# Patient Record
Sex: Male | Born: 1975 | Race: Black or African American | Hispanic: No | Marital: Single | Smoking: Never smoker
Health system: Southern US, Community
[De-identification: ages and names within clinical notes are randomized; demographics above are authoritative.]

## PROBLEM LIST (undated history)

## (undated) DIAGNOSIS — I10 Essential (primary) hypertension: Secondary | ICD-10-CM

## (undated) HISTORY — DX: Essential (primary) hypertension: I10

---

## 2018-06-30 ENCOUNTER — Ambulatory Visit (INDEPENDENT_AMBULATORY_CARE_PROVIDER_SITE_OTHER): Payer: Medicare Other | Admitting: Family Medicine

## 2018-06-30 ENCOUNTER — Encounter: Payer: Self-pay | Admitting: Family Medicine

## 2018-06-30 VITALS — BP 130/88 | HR 84 | Temp 97.4°F | Resp 16 | Ht 64.0 in | Wt 144.8 lb

## 2018-06-30 DIAGNOSIS — F79 Unspecified intellectual disabilities: Secondary | ICD-10-CM

## 2018-06-30 DIAGNOSIS — Z7689 Persons encountering health services in other specified circumstances: Secondary | ICD-10-CM | POA: Diagnosis not present

## 2018-06-30 DIAGNOSIS — Z23 Encounter for immunization: Secondary | ICD-10-CM

## 2018-06-30 DIAGNOSIS — R7303 Prediabetes: Secondary | ICD-10-CM | POA: Diagnosis not present

## 2018-06-30 DIAGNOSIS — L409 Psoriasis, unspecified: Secondary | ICD-10-CM | POA: Diagnosis not present

## 2018-06-30 NOTE — Progress Notes (Addendum)
Subjective:    Patient ID: Peter Mcmahon, male    DOB: 07/15/1976, 42 y.o.   MRN: [id number]  Peter Mcmahon is a 42 y.o. male presenting on 06/30/2018 for Establish Care (concerned about diabetes family Hx ); Pre-Diabetes; and Psoriasis  Patient provides limited verbal history due to Cognitive Disability, primarily history is provided by patient's friend and roommate / caregiver - Peter Mcmahon here today.  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 He works locally occasionally. Lives with family friend and caregiver.  Pre-Diabetes Reports that he was previously diagnosed about 4-5 yr PreDM, he was not treated and was lost to follow-up for this issue for long time. Friend who he lives with is diabetic. Not checking CBG Meds: None Lifestyle: - Diet (reportedly he has sweet tooth, eats balanced granola, oatmeal, fruits, some chips - drinks some koolaid and juice, less water - does some home cooking) - Exercise (activity, some walking) Denies hypoglycemia, polyuria, visual changes, numbness or tingling.  PSORIASIS Previously living in [place] - previously Dermatology - rx Psoriasis cream and shampoo. Now he is having some worse dry and irritated skin would like to return to Dermatologist for further treatment. Never had history of arthritis.  Elevated BP Initial elevated BP, no prior dx HTN. Not checking BP at home but they can. Fam history mother and grandmother on mom side HTN - sister  Health Maintenance:  Due for routine HIV - no high risk history but would like to check.  Colon CA Screening: Never had colonoscopy. Currently asymptomatic. Known family history of colon CA, father age 88>60. Nealry due for screening test age 49>45 will place referral in future for GI   No flowsheet data found.  History  reviewed. No pertinent past medical history. History reviewed. No pertinent surgical history. Social History   Socioeconomic History  . Marital status: Single    Spouse name: Not on file  . Number of children: Not on file  . Years of education: Not on file  . Highest education level: Not on file  Occupational History  . Not on file  Social Needs  . Financial resource strain: Not on file  . Food insecurity:    Worry: Not on file    Inability: Not on file  . Transportation needs:    Medical: Not on file    Non-medical: Not on file  Tobacco Use  . Smoking status: Never Smoker  . Smokeless tobacco: Never Used  Substance and Sexual Activity  . Alcohol use: Never    Frequency: Never  . Drug use: Never  . Sexual activity: Not on file  Lifestyle  . Physical activity:    Days per week: Not on file    Minutes per session: Not on file  . Stress: Not on file  Relationships  . Social connections:    Talks on phone: Not on file    Gets together: Not on file    Attends religious service: Not on file    Active member of club or organization: Not on file    Attends meetings of clubs or organizations: Not on file    Relationship status: Not on file  . Intimate partner violence:    Fear of current or ex partner: Not on file    Emotionally abused: Not on file    Physically abused: Not on file    Forced  sexual activity: Not on file  Other Topics Concern  . Not on file  Social History Narrative  . Not on file   Family History  Problem Relation Age of Onset  . Diabetes Father   . Colon cancer Father   . Hypertension Mother   . Diabetes Sister   . Diabetes Brother   . Diabetes Paternal Uncle   . Diabetes Paternal Grandfather   . Prostate cancer Neg Hx    No current outpatient medications on file prior to visit.   No current facility-administered medications on file prior to visit.     Review of Systems Per HPI unless specifically indicated above      Objective:    BP  130/88 (BP Location: Left Arm, Cuff Size: Normal)   Pulse 84   Temp (!) 97.4 F (36.3 C) (Oral)   Resp 16   Ht 5\' 4"  (1.626 m)   Wt 144 lb 12.8 oz (65.7 kg)   BMI 24.85 kg/m   Wt Readings from Last 3 Encounters:  06/30/18 144 lb 12.8 oz (65.7 kg)    Physical Exam  Constitutional: He is oriented to person, place, and time. He appears well-developed and well-nourished. No distress.  Well-appearing, comfortable, cooperative  HENT:  Head: Normocephalic and atraumatic.  Mouth/Throat: Oropharynx is clear and moist.  Eyes: Conjunctivae are normal. Right eye exhibits no discharge. Left eye exhibits no discharge.  Cardiovascular: Normal rate.  Pulmonary/Chest: Effort normal.  Musculoskeletal: He exhibits no edema.  Neurological: He is alert and oriented to person, place, and time.  Skin: Skin is warm and dry. No rash noted. He is not diaphoretic. No erythema.  Well groomed, good eye contact, speech is slowed at times, and often has difficulty following conversation, thoughts are appropriate but simple, has difficulty comprehending more complex thoughts and conversation. Follows commands well. Pleasant.  Psychiatric: He has a normal mood and affect. His behavior is normal.  Well groomed, good eye contact, normal speech and thoughts  Nursing note and vitals reviewed.  No results found for this or any previous visit.    Assessment & Plan:   Problem List Items Addressed This Visit    Intellectual disability    Stable chronic problem Functions well especially with assistance of friend and caregiver      Pre-diabetes - Primary    Uncertain control, prior old result >4-5 years ago Due for labs in near future  Plan:  1. Not on any therapy currently  2. Encourage improved lifestyle - low carb, low sugar diet, reduce portion size, continue improving regular exercise - handout on diet given 3. Follow-up within 2-4 weeks for annual and labs       Psoriasis    Stable chronic problem with  some skin manifestation Referral to local Dermatology for further management      Relevant Orders   Ambulatory referral to Dermatology    Other Visit Diagnoses    Encounter to establish care with new doctor     Reques outside PCP records for review    Need for Tdap vaccination       Relevant Orders   Tdap vaccine greater than or equal to 7yo IM (Completed)      No orders of the defined types were placed in this encounter.  Orders Placed This Encounter  Procedures  . Tdap vaccine greater than or equal to 7yo IM  . Ambulatory referral to Dermatology    Referral Priority:   Routine    Referral Type:  Consultation    Referral Reason:   Specialty Services Required    Referred to Provider:   Dasher, Cliffton Astersavid A, MD    Requested Specialty:   Dermatology    Number of Visits Requested:   1    Follow up plan: Return in about 5 weeks (around 08/04/2018) for Annual Physical.  Future labs ordered for 08/03/18  Saralyn PilarAlexander Mavric Cortright, DO [facility] 07/01/2018, 1:10 AM

## 2018-06-30 NOTE — Patient Instructions (Addendum)
Thank you for coming to the office today.  Tdap vaccine today - good for 10 years  Referral to Dermatology for Psoriasis - stay tuned for apt - call them in 1 week.  [facility] [address] [place], [place] [address] Phone: [phone]  Arin L. Roseanne KaufmanIsenstein, MD  Pilar Plateavid Dasher, MD -------------------------------------------------------------  Elevated BP initially - repeat check was improved to 130/88 - not quite at level of high blood pressure, we need to monitor closely - check at home few times a month, write down, bring back to office.  Goal < 140/90   DUE for FASTING BLOOD WORK (no food or drink after midnight before the lab appointment, only water or coffee without cream/sugar on the morning of)  SCHEDULE "Lab Only" visit in the morning at the clinic for lab draw in 4-5 WEEKS   - Make sure Lab Only appointment is at about 1 week before your next appointment, so that results will be available  For Lab Results, once available within 2-3 days of blood draw, you can can log in to MyChart online to view your results and a brief explanation. Also, we can discuss results at next follow-up visit.   Please schedule a Follow-up Appointment to: Return in about 5 weeks (around 08/04/2018) for Annual Physical.  If you have any other questions or concerns, please feel free to call the office or send a message through MyChart. You may also schedule an earlier appointment if necessary.  Additionally, you may be receiving a survey about your experience at our office within a few days to 1 week by e-mail or mail. We value your feedback.  Saralyn PilarAlexander Keali Mccraw, DO [facility]

## 2018-07-01 ENCOUNTER — Other Ambulatory Visit: Payer: Self-pay | Admitting: Family Medicine

## 2018-07-01 ENCOUNTER — Encounter: Payer: Self-pay | Admitting: Family Medicine

## 2018-07-01 DIAGNOSIS — L409 Psoriasis, unspecified: Secondary | ICD-10-CM

## 2018-07-01 DIAGNOSIS — Z114 Encounter for screening for human immunodeficiency virus [HIV]: Secondary | ICD-10-CM

## 2018-07-01 DIAGNOSIS — Z0000 Encounter for general adult medical examination without abnormal findings: Secondary | ICD-10-CM

## 2018-07-01 DIAGNOSIS — R7303 Prediabetes: Secondary | ICD-10-CM

## 2018-07-01 DIAGNOSIS — F79 Unspecified intellectual disabilities: Secondary | ICD-10-CM

## 2018-07-01 NOTE — Assessment & Plan Note (Signed)
Uncertain control, prior old result >4-5 years ago Due for labs in near future  Plan:  1. Not on any therapy currently  2. Encourage improved lifestyle - low carb, low sugar diet, reduce portion size, continue improving regular exercise - handout on diet given 3. Follow-up within 2-4 weeks for annual and labs

## 2018-07-01 NOTE — Assessment & Plan Note (Signed)
Stable chronic problem with some skin manifestation Referral to local Dermatology for further management

## 2018-07-01 NOTE — Assessment & Plan Note (Signed)
Stable chronic problem Functions well especially with assistance of friend and caregiver 

## 2018-08-03 ENCOUNTER — Other Ambulatory Visit: Payer: Medicare Other

## 2018-08-03 DIAGNOSIS — Z114 Encounter for screening for human immunodeficiency virus [HIV]: Secondary | ICD-10-CM

## 2018-08-03 DIAGNOSIS — R7303 Prediabetes: Secondary | ICD-10-CM

## 2018-08-03 DIAGNOSIS — L409 Psoriasis, unspecified: Secondary | ICD-10-CM

## 2018-08-03 DIAGNOSIS — Z0000 Encounter for general adult medical examination without abnormal findings: Secondary | ICD-10-CM

## 2018-08-04 LAB — COMPLETE METABOLIC PANEL WITH GFR
AG RATIO: 1.3 (calc) (ref 1.0–2.5)
ALBUMIN MSPROF: 4.4 g/dL (ref 3.6–5.1)
ALKALINE PHOSPHATASE (APISO): 78 U/L (ref 40–115)
ALT: 19 U/L (ref 9–46)
AST: 20 U/L (ref 10–40)
BUN: 20 mg/dL (ref 7–25)
CO2: 29 mmol/L (ref 20–32)
CREATININE: 1.02 mg/dL (ref 0.60–1.35)
Calcium: 9.4 mg/dL (ref 8.6–10.3)
Chloride: 105 mmol/L (ref 98–110)
GFR, Est African American: 103 mL/min/{1.73_m2} (ref >=60)
GFR, Est Non African American: 89 mL/min/{1.73_m2} (ref >=60)
GLOBULIN: 3.4 g/dL (ref 1.9–3.7)
Glucose, Bld: 103 mg/dL — ABNORMAL HIGH (ref 65–99)
Potassium: 4.8 mmol/L (ref 3.5–5.3)
SODIUM: 141 mmol/L (ref 135–146)
Total Bilirubin: 0.7 mg/dL (ref 0.2–1.2)
Total Protein: 7.8 g/dL (ref 6.1–8.1)

## 2018-08-04 LAB — CBC WITH DIFFERENTIAL/PLATELET
Basophils Absolute: 22 cells/uL (ref 0–200)
Basophils Relative: 0.5 %
Eosinophils Absolute: 40 cells/uL (ref 15–500)
Eosinophils Relative: 0.9 %
HCT: 37.7 % — ABNORMAL LOW (ref 38.5–50.0)
Hemoglobin: 13 g/dL — ABNORMAL LOW (ref 13.2–17.1)
Lymphs Abs: 1404 cells/uL (ref 850–3900)
MCH: 31.4 pg (ref 27.0–33.0)
MCHC: 34.5 g/dL (ref 32.0–36.0)
MCV: 91.1 fL (ref 80.0–100.0)
MPV: 11.3 fL (ref 7.5–12.5)
Monocytes Relative: 13.1 %
NEUTROS PCT: 53.6 %
Neutro Abs: 2358 cells/uL (ref 1500–7800)
PLATELETS: 171 10*3/uL (ref 140–400)
RBC: 4.14 10*6/uL — AB (ref 4.20–5.80)
RDW: 13.3 % (ref 11.0–15.0)
TOTAL LYMPHOCYTE: 31.9 %
WBC: 4.4 10*3/uL (ref 3.8–10.8)
WBCMIX: 576 {cells}/uL (ref 200–950)

## 2018-08-04 LAB — LIPID PANEL
CHOLESTEROL: 240 mg/dL — AB (ref <200)
HDL: 53 mg/dL (ref >40)
LDL CHOLESTEROL (CALC): 169 mg/dL — AB
Non-HDL Cholesterol (Calc): 187 mg/dL (calc) — ABNORMAL HIGH (ref <130)
TRIGLYCERIDES: 81 mg/dL (ref <150)
Total CHOL/HDL Ratio: 4.5 (calc) (ref <5.0)

## 2018-08-04 LAB — HIV ANTIBODY (ROUTINE TESTING W REFLEX): HIV: NONREACTIVE

## 2018-08-04 LAB — HEMOGLOBIN A1C
EAG (MMOL/L): 6.3 (calc)
Hgb A1c MFr Bld: 5.6 % of total Hgb (ref <5.7)
Mean Plasma Glucose: 114 (calc)

## 2018-08-10 ENCOUNTER — Ambulatory Visit (INDEPENDENT_AMBULATORY_CARE_PROVIDER_SITE_OTHER): Payer: Medicare Other | Admitting: Family Medicine

## 2018-08-10 ENCOUNTER — Encounter: Payer: Self-pay | Admitting: Family Medicine

## 2018-08-10 VITALS — BP 136/84 | HR 78 | Temp 97.8°F | Resp 16 | Ht 64.0 in | Wt 146.0 lb

## 2018-08-10 DIAGNOSIS — R030 Elevated blood-pressure reading, without diagnosis of hypertension: Secondary | ICD-10-CM

## 2018-08-10 DIAGNOSIS — I10 Essential (primary) hypertension: Secondary | ICD-10-CM | POA: Insufficient documentation

## 2018-08-10 DIAGNOSIS — K1379 Other lesions of oral mucosa: Secondary | ICD-10-CM

## 2018-08-10 DIAGNOSIS — F79 Unspecified intellectual disabilities: Secondary | ICD-10-CM

## 2018-08-10 DIAGNOSIS — R7303 Prediabetes: Secondary | ICD-10-CM

## 2018-08-10 DIAGNOSIS — Z0000 Encounter for general adult medical examination without abnormal findings: Secondary | ICD-10-CM

## 2018-08-10 DIAGNOSIS — L409 Psoriasis, unspecified: Secondary | ICD-10-CM

## 2018-08-10 DIAGNOSIS — E7800 Pure hypercholesterolemia, unspecified: Secondary | ICD-10-CM

## 2018-08-10 NOTE — Assessment & Plan Note (Signed)
Stable chronic problem Functions well especially with assistance of friend and caregiver 

## 2018-08-10 NOTE — Progress Notes (Signed)
Subjective:    Patient ID: Peter Mcmahon, male    DOB: 07/02/1976, 42 y.o.   MRN: [id number]  Peter Mcmahon is a 42 y.o. male presenting on 08/10/2018 for Annual Exam  Patient provides limited verbal history due to Cognitive Disability, primarily history is provided by patient's friend and roommate / caregiver - [name] here today.  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 He works locally occasionally. Lives with family friend and caregiver.  Pre-Diabetes Last result A1c 5.6 Not checking CBG Meds: None Lifestyle: - Diet (Still trying to improve diet, low carb, he has sweet tooth, eats balanced granola, oatmeal, fruits, some chips - drinks some koolaid and juice, less water - does some home cooking) - Exercise (activity, some walking) Denies hypoglycemia  HYPERLIPIDEMIA: - Last lipid panel 07/2018, abnormal elevated LDL Not taking Statin therapy  PSORIASIS Followed by [facility]. On current topical cream with relief  Easy Bleeding / Dental work Recently seen by [facility], had problems with easy bleeding from multiple tooth extraction. They requested copy of his most recent lab work. Had ibuprofen, hydrocodone PRN  Elevated BP w/o HTN Interval readings of BP since last visit, showed improved BP 120-130 on avg / 70-80s. Occasional higher reading. Fam history mother and grandmother on mom side HTN - sister Not on medication  Health Maintenance:  Due for Flu Shot, declines today despite counseling on benefits  UTD routine HIV screen, negative  Colon CA Screening: Never had colonoscopy. Currently asymptomatic. Known family history of colon CA, father age 61>60. Nealry due for screening test age 34>45 will place referral in future for GI   Dep[facility]  08/10/2018  Decreased Interest 0  Down, Depressed, Hopeless 0  PHQ - 2 Score 0    History reviewed. No pertinent past medical history. History reviewed. No pertinent surgical history. Social History   Socioeconomic History  . Marital status: Single    Spouse name: Not on file  . Number of children: Not on file  . Years of education: Not on file  . Highest education level: Not on file  Occupational History  . Not on file  Social Needs  . Financial resource strain: Not on file  . Food insecurity:    Worry: Not on file    Inability: Not on file  . Transportation needs:    Medical: Not on file    Non-medical: Not on file  Tobacco Use  . Smoking status: Never Smoker  . Smokeless tobacco: Never Used  Substance and Sexual Activity  . Alcohol use: Never    Frequency: Never  . Drug use: Never  . Sexual activity: Not on file  Lifestyle  . Physical activity:    Days per week: Not on file    Minutes per session: Not on file  . Stress: Not on file  Relationships  . Social connections:    Talks on phone: Not on file    Gets together: Not on file    Attends religious service: Not on file    Active member of club or organization: Not on file    Attends meetings of clubs or organizations: Not on file    Relationship status: Not on file  . Intimate partner violence:    Fear of current or ex partner: Not on file    Emotionally abused: Not on file  Physically abused: Not on file    Forced sexual activity: Not on file  Other Topics Concern  . Not on file  Social History Narrative  . Not on file   Family History  Problem Relation Age of Onset  . Diabetes Father   . Colon cancer Father   . Hypertension Mother   . Diabetes Sister   . Diabetes Brother   . Diabetes Paternal Uncle   . Diabetes Paternal Grandfather   . Prostate cancer Neg Hx    No current outpatient medications on file prior to visit.   No current facility-administered medications on file prior to visit.       Review of Systems  Constitutional: Negative for activity change, appetite change, chills, diaphoresis, fatigue and fever.  HENT: Negative for congestion and hearing loss.   Eyes: Negative for visual disturbance.  Respiratory: Negative for apnea, cough, choking, chest tightness, shortness of breath and wheezing.   Cardiovascular: Negative for chest pain, palpitations and leg swelling.  Gastrointestinal: Negative for abdominal pain, anal bleeding, blood in stool, constipation, diarrhea, nausea and vomiting.  Endocrine: Negative for cold intolerance.  Genitourinary: Negative for decreased urine volume, difficulty urinating, dysuria, frequency, hematuria, testicular pain and urgency.  Musculoskeletal: Negative for arthralgias, back pain and neck pain.  Skin: Negative for rash.  Allergic/Immunologic: Negative for environmental allergies.  Neurological: Negative for dizziness, weakness, light-headedness, numbness and headaches.  Hematological: Negative for adenopathy. Bruises/bleeds easily.  Psychiatric/Behavioral: Negative for behavioral problems, dysphoric mood and sleep disturbance. The patient is not nervous/anxious.    Per HPI unless specifically indicated above     Objective:    BP 136/84 (BP Location: Left Arm, Cuff Size: Normal)   Pulse 78   Temp 97.8 F (36.6 C) (Oral)   Resp 16   Ht 5\' 4"  (1.626 m)   Wt 146 lb (66.2 kg)   BMI 25.06 kg/m   Wt Readings from Last 3 Encounters:  08/10/18 146 lb (66.2 kg)  06/30/18 144 lb 12.8 oz (65.7 kg)    Physical Exam  Constitutional: He is oriented to person, place, and time. He appears well-developed and well-nourished. No distress.  Well-appearing, comfortable, cooperative  HENT:  Head: Normocephalic and atraumatic.  Mouth/Throat: Oropharynx is clear and moist.  Poor dentition, missing several teeth. No oral lesions or ulceration or bleeding today.  Eyes: Pupils are equal, round, and reactive to light. Conjunctivae and EOM are  normal. Right eye exhibits no discharge. Left eye exhibits no discharge.  Neck: Normal range of motion. Neck supple. No thyromegaly present.  Cardiovascular: Normal rate, regular rhythm, normal heart sounds and intact distal pulses.  No murmur heard. Pulmonary/Chest: Effort normal and breath sounds normal. No respiratory distress. He has no wheezes. He has no rales.  Abdominal: Soft. Bowel sounds are normal. He exhibits no distension and no mass. There is no tenderness.  Musculoskeletal: Normal range of motion. He exhibits no edema or tenderness.  Upper / Lower Extremities: - Normal muscle tone, strength bilateral upper extremities 5/5, lower extremities 5/5  Lymphadenopathy:    He has no cervical adenopathy.  Neurological: He is alert and oriented to person, place, and time.  Distal sensation intact to light touch all extremities  Skin: Skin is warm and dry. No rash noted. He is not diaphoretic. No erythema.  Psychiatric: He has a normal mood and affect. His behavior is normal.  Well groomed, good eye contact, speech is slowed at times, and often has difficulty following conversation, thoughts are appropriate but  simple, has difficulty comprehending more complex thoughts and conversation. Follows commands well. Pleasant.   Nursing note and vitals reviewed.  Results for orders placed or performed in visit on 08/03/18  HIV antibody  Result Value Ref Range   HIV 1&2 Ab, 4th Generation NON-REACTIVE NON-REACTI  Lipid panel  Result Value Ref Range   Cholesterol 240 (H) <200 mg/dL   HDL 53 >40 mg/dL   Triglycerides 81 <981 mg/dL   LDL Cholesterol (Calc) 169 (H) mg/dL (calc)   Total CHOL/HDL Ratio 4.5 <5.0 (calc)   Non-HDL Cholesterol (Calc) 187 (H) <130 mg/dL (calc)  COMPLETE METABOLIC PANEL WITH GFR  Result Value Ref Range   Glucose, Bld 103 (H) 65 - 99 mg/dL   BUN 20 7 - 25 mg/dL   Creat 1.91 4.78 - 2.95 mg/dL   GFR, Est Non African American 89 > OR = 60 mL/min/1.7m2   GFR, Est  African American 103 > OR = 60 mL/min/1.7m2   BUN/Creatinine Ratio NOT APPLICABLE 6 - 22 (calc)   Sodium 141 135 - 146 mmol/L   Potassium 4.8 3.5 - 5.3 mmol/L   Chloride 105 98 - 110 mmol/L   CO2 29 20 - 32 mmol/L   Calcium 9.4 8.6 - 10.3 mg/dL   Total Protein 7.8 6.1 - 8.1 g/dL   Albumin 4.4 3.6 - 5.1 g/dL   Globulin 3.4 1.9 - 3.7 g/dL (calc)   AG Ratio 1.3 1.0 - 2.5 (calc)   Total Bilirubin 0.7 0.2 - 1.2 mg/dL   Alkaline phosphatase (APISO) 78 40 - 115 U/L   AST 20 10 - 40 U/L   ALT 19 9 - 46 U/L  CBC with Differential/Platelet  Result Value Ref Range   WBC 4.4 3.8 - 10.8 Thousand/uL   RBC 4.14 (L) 4.20 - 5.80 Million/uL   Hemoglobin 13.0 (L) 13.2 - 17.1 g/dL   HCT 62.1 (L) 30.8 - 65.7 %   MCV 91.1 80.0 - 100.0 fL   MCH 31.4 27.0 - 33.0 pg   MCHC 34.5 32.0 - 36.0 g/dL   RDW 84.6 96.2 - 95.2 %   Platelets 171 140 - 400 Thousand/uL   MPV 11.3 7.5 - 12.5 fL   Neutro Abs 2,358 1,500 - 7,800 cells/uL   Lymphs Abs 1,404 850 - 3,900 cells/uL   WBC mixed population 576 200 - 950 cells/uL   Eosinophils Absolute 40 15 - 500 cells/uL   Basophils Absolute 22 0 - 200 cells/uL   Neutrophils Relative % 53.6 %   Total Lymphocyte 31.9 %   Monocytes Relative 13.1 %   Eosinophils Relative 0.9 %   Basophils Relative 0.5 %  Hemoglobin A1c  Result Value Ref Range   Hgb A1c MFr Bld 5.6 <5.7 % of total Hgb   Mean Plasma Glucose 114 (calc)   eAG (mmol/L) 6.3 (calc)      Assessment & Plan:   Problem List Items Addressed This Visit    Elevated BP without diagnosis of hypertension    Elevated BP on initial reading, improve on re-check Home readings improved  Plan Reassurance Continue monitor BP at home, record readings Followup if elevated      Hypercholesteremia    Uncontrolled cholesterol elevated LDL, poor lifestyle, improving Last lipid panel 07/2018  Plan: 1. ASCVD risk not indicated for statin at this time 2. Encourage improved lifestyle - low carb/cholesterol, reduce  portion size, continue improving regular exercise Follow-up yearly       Intellectual disability    Stable  chronic problem Functions well especially with assistance of friend and caregiver      Pre-diabetes    Stable, controlled A1c 5.6  Plan:  1. Not on any therapy currently  2. Encourage improved lifestyle - low carb, low sugar diet, reduce portion size, continue improving regular exercise - handout on diet given 3. Follow-up 6 mo A1c      Psoriasis    Followed by Dermatology On topical for psoriasis       Other Visit Diagnoses    Annual physical exam    -  Primary  Updated Health Maintenance information Reviewed recent lab results with patient Encouraged improvement to lifestyle with diet and exercise    Oral bleeding     Uncertain etiology, reviewed labs unremarkable CBC except mild low Hgb only borderline, no other indices to suggest bleeding disorder. Normal LFTs. Given copy to patient to give to dentist. If they request we can add future INR or other coag testing vs refer to heme if still clinical concern.      No orders of the defined types were placed in this encounter.   Follow up plan: Return in about 6 months (around 02/08/2019) for Elevated Sugar check A1c / Elevated BP check.  Saralyn Pilar, DO [facility] 08/10/2018, 2:33 PM

## 2018-08-10 NOTE — Patient Instructions (Addendum)
Thank you for coming to the office today.  Elevated BP initially - repeat check was improved - not quite at level of high blood pressure, we need to monitor closely - check at home few times a month, write down, bring back to office.  Goal < 140/90  Can check BP at home 1-2 times a day ONCE A WEEK, for now, and then space out to every 2 WEEKS in future.  Bring copy of lab results to Dentist  Only other lab test I would offer would be an INR test. Or if they need additional testing we can refer to a Hematology specialist  If change mind on Flu shot, can return.  Please schedule a Follow-up Appointment to: Return in about 6 months (around 02/08/2019) for Elevated Sugar check A1c / Elevated BP check.  If you have any other questions or concerns, please feel free to call the office or send a message through MyChart. You may also schedule an earlier appointment if necessary.  Additionally, you may be receiving a survey about your experience at our office within a few days to 1 week by e-mail or mail. We value your feedback.  Saralyn PilarAlexander [name], DO [facility]

## 2018-08-10 NOTE — Assessment & Plan Note (Signed)
Followed by Dermatology On topical for psoriasis

## 2018-08-10 NOTE — Assessment & Plan Note (Signed)
Stable, controlled A1c 5.6  Plan:  1. Not on any therapy currently  2. Encourage improved lifestyle - low carb, low sugar diet, reduce portion size, continue improving regular exercise - handout on diet given 3. Follow-up 6 mo A1c

## 2018-08-11 NOTE — Assessment & Plan Note (Signed)
Elevated BP on initial reading, improve on re-check Home readings improved  Plan Reassurance Continue monitor BP at home, record readings Followup if elevated

## 2018-08-11 NOTE — Assessment & Plan Note (Signed)
Uncontrolled cholesterol elevated LDL, poor lifestyle, improving Last lipid panel 07/2018  Plan: 1. ASCVD risk not indicated for statin at this time 2. Encourage improved lifestyle - low carb/cholesterol, reduce portion size, continue improving regular exercise Follow-up yearly

## 2018-11-09 ENCOUNTER — Telehealth: Payer: Self-pay | Admitting: Family Medicine

## 2018-11-09 NOTE — Telephone Encounter (Signed)
Tried to call patient to schedule AWV-I.  No answer.  Left message on answering machine to call Misty StanleyLisa at [phone]. lec

## 2018-11-12 NOTE — Telephone Encounter (Signed)
On 11/09/18, patient scheduled AWV for 01/15/19 at 10:40 am. lec

## 2019-01-05 ENCOUNTER — Ambulatory Visit (INDEPENDENT_AMBULATORY_CARE_PROVIDER_SITE_OTHER): Payer: Medicare Other

## 2019-01-05 VITALS — BP 132/70 | HR 74 | Temp 98.9°F | Resp 16 | Ht 64.0 in | Wt 152.0 lb

## 2019-01-05 DIAGNOSIS — Z0000 Encounter for general adult medical examination without abnormal findings: Secondary | ICD-10-CM | POA: Diagnosis not present

## 2019-01-05 NOTE — Patient Instructions (Addendum)
Peter Mcmahon , Thank you for taking time to come for your Medicare Wellness Visit. I appreciate your ongoing commitment to your health goals. Please review the following plan we discussed and let me know if I can assist you in the future.   Screening recommendations/referrals: Colonoscopy: due at age 45 due to family hx Recommended yearly ophthalmology/optometry visit for glaucoma screening and checkup Recommended yearly dental visit for hygiene and checkup  Vaccinations: Influenza vaccine: up to date Pneumococcal vaccine: not indicated, due at age 70 Tdap vaccine: up to date Shingles vaccine: eligible at age 36    Advanced directives: Advance directive discussed with you today. I have provided a copy for you to complete at home and have notarized. Once this is complete please bring a copy in to our office so we can scan it into your chart.  Conditions/risks identified: none  Next appointment: Follow up on 02/08/2019 at 10:40am with Dr.[name]. Follow up in one year for your annual wellness exam.   Preventive Care 40-64 Years, Male Preventive care refers to lifestyle choices and visits with your health care provider that can promote health and wellness. What does preventive care include?  A yearly physical exam. This is also called an annual well check.  Dental exams once or twice a year.  Routine eye exams. Ask your health care provider how often you should have your eyes checked.  Personal lifestyle choices, including:  Daily care of your teeth and gums.  Regular physical activity.  Eating a healthy diet.  Avoiding tobacco and drug use.  Limiting alcohol use.  Practicing safe sex.  Taking low-dose aspirin every day starting at age 41. What happens during an annual well check? The services and screenings done by your health care provider during your annual well check will depend on your age, overall health, lifestyle risk factors, and family history of  disease. Counseling  Your health care provider may ask you questions about your:  Alcohol use.  Tobacco use.  Drug use.  Emotional well-being.  Home and relationship well-being.  Sexual activity.  Eating habits.  Work and work Astronomer. Screening  You may have the following tests or measurements:  Height, weight, and BMI.  Blood pressure.  Lipid and cholesterol levels. These may be checked every 5 years, or more frequently if you are over 84 years old.  Skin check.  Lung cancer screening. You may have this screening every year starting at age 66 if you have a 30-pack-year history of smoking and currently smoke or have quit within the past 15 years.  Fecal occult blood test (FOBT) of the stool. You may have this test every year starting at age 51.  Flexible sigmoidoscopy or colonoscopy. You may have a sigmoidoscopy every 5 years or a colonoscopy every 10 years starting at age 9.  Prostate cancer screening. Recommendations will vary depending on your family history and other risks.  Hepatitis C blood test.  Hepatitis B blood test.  Sexually transmitted disease (STD) testing.  Diabetes screening. This is done by checking your blood sugar (glucose) after you have not eaten for a while (fasting). You may have this done every 1-3 years. Discuss your test results, treatment options, and if necessary, the need for more tests with your health care provider. Vaccines  Your health care provider may recommend certain vaccines, such as:  Influenza vaccine. This is recommended every year.  Tetanus, diphtheria, and acellular pertussis (Tdap, Td) vaccine. You may need a Td booster every 10 years.  Zoster vaccine. You may need this after age 76.  Pneumococcal 13-valent conjugate (PCV13) vaccine. You may need this if you have certain conditions and have not been vaccinated.  Pneumococcal polysaccharide (PPSV23) vaccine. You may need one or two doses if you smoke cigarettes  or if you have certain conditions. Talk to your health care provider about which screenings and vaccines you need and how often you need them. This information is not intended to replace advice given to you by your health care provider. Make sure you discuss any questions you have with your health care provider. Document Released: 12/01/2015 Document Revised: 07/24/2016 Document Reviewed: 09/05/2015 Elsevier Interactive Patient Education  2017 [employer] Prevention in the Home Falls can cause injuries. They can happen to people of all ages. There are many things you can do to make your home safe and to help prevent falls. What can I do on the outside of my home?  Regularly fix the edges of walkways and driveways and fix any cracks.  Remove anything that might make you trip as you walk through a door, such as a raised step or threshold.  Trim any bushes or trees on the path to your home.  Use bright outdoor lighting.  Clear any walking paths of anything that might make someone trip, such as rocks or tools.  Regularly check to see if handrails are loose or broken. Make sure that both sides of any steps have handrails.  Any raised decks and porches should have guardrails on the edges.  Have any leaves, snow, or ice cleared regularly.  Use sand or salt on walking paths during winter.  Clean up any spills in your garage right away. This includes oil or grease spills. What can I do in the bathroom?  Use night lights.  Install grab bars by the toilet and in the tub and shower. Do not use towel bars as grab bars.  Use non-skid mats or decals in the tub or shower.  If you need to sit down in the shower, use a plastic, non-slip stool.  Keep the floor dry. Clean up any water that spills on the floor as soon as it happens.  Remove soap buildup in the tub or shower regularly.  Attach bath mats securely with double-sided non-slip rug tape.  Do not have throw rugs and other  things on the floor that can make you trip. What can I do in the bedroom?  Use night lights.  Make sure that you have a light by your bed that is easy to reach.  Do not use any sheets or blankets that are too big for your bed. They should not hang down onto the floor.  Have a firm chair that has side arms. You can use this for support while you get dressed.  Do not have throw rugs and other things on the floor that can make you trip. What can I do in the kitchen?  Clean up any spills right away.  Avoid walking on wet floors.  Keep items that you use a lot in easy-to-reach places.  If you need to reach something above you, use a strong step stool that has a grab bar.  Keep electrical cords out of the way.  Do not use floor polish or wax that makes floors slippery. If you must use wax, use non-skid floor wax.  Do not have throw rugs and other things on the floor that can make you trip. What can I do with  my stairs?  Do not leave any items on the stairs.  Make sure that there are handrails on both sides of the stairs and use them. Fix handrails that are broken or loose. Make sure that handrails are as long as the stairways.  Check any carpeting to make sure that it is firmly attached to the stairs. Fix any carpet that is loose or worn.  Avoid having throw rugs at the top or bottom of the stairs. If you do have throw rugs, attach them to the floor with carpet tape.  Make sure that you have a light switch at the top of the stairs and the bottom of the stairs. If you do not have them, ask someone to add them for you. What else can I do to help prevent falls?  Wear shoes that:  Do not have high heels.  Have rubber bottoms.  Are comfortable and fit you well.  Are closed at the toe. Do not wear sandals.  If you use a stepladder:  Make sure that it is fully opened. Do not climb a closed stepladder.  Make sure that both sides of the stepladder are locked into place.  Ask  someone to hold it for you, if possible.  Clearly mark and make sure that you can see:  Any grab bars or handrails.  First and last steps.  Where the edge of each step is.  Use tools that help you move around (mobility aids) if they are needed. These include:  Canes.  Walkers.  Scooters.  Crutches.  Turn on the lights when you go into a dark area. Replace any light bulbs as soon as they burn out.  Set up your furniture so you have a clear path. Avoid moving your furniture around.  If any of your floors are uneven, fix them.  If there are any pets around you, be aware of where they are.  Review your medicines with your doctor. Some medicines can make you feel dizzy. This can increase your chance of falling. Ask your doctor what other things that you can do to help prevent falls. This information is not intended to replace advice given to you by your health care provider. Make sure you discuss any questions you have with your health care provider. Document Released: 08/31/2009 Document Revised: 04/11/2016 Document Reviewed: 12/09/2014 Elsevier Interactive Patient Education  2017 [employer].

## 2019-01-05 NOTE — Progress Notes (Signed)
Subjective:   Peter Mcmahon is a 43 y.o. male who presents for an Initial Medicare Annual Wellness Visit.  Review of Systems  Peter Mcmahon, patients fiance is with him today and helps with patients medical history.       Objective:    Today's Vitals   01/05/19 1442  BP: 132/70  Pulse: 74  Resp: 16  Temp: 98.9 F (37.2 C)  TempSrc: Oral  Weight: 152 lb (68.9 kg)  Height: 5\' 4"  (1.626 m)  PainSc: 0-No pain   Body mass index is 26.09 kg/m.  Advanced Directives 01/05/2019  Does Patient Have a Medical Advance Directive? No  Would patient like information on creating a medical advance directive? Yes (MAU/Ambulatory/Procedural Areas - Information given)    Current Medications (verified) No outpatient encounter medications on file as of 01/05/2019.   No facility-administered encounter medications on file as of 01/05/2019.     Allergies (verified) Patient has no known allergies.   History: History reviewed. No pertinent past medical history. History reviewed. No pertinent surgical history. Family History  Problem Relation Age of Onset  . Diabetes Father   . Colon cancer Father   . Hypertension Mother   . Diabetes Sister   . Diabetes Brother   . Diabetes Paternal Uncle   . Diabetes Paternal Grandfather   . Prostate cancer Neg Hx    Social History   Socioeconomic History  . Marital status: Significant Other    Spouse name: Not on file  . Number of children: Not on file  . Years of education: Not on file  . Highest education level: High school graduate  Occupational History  . Occupation: disability   Social Needs  . Financial resource strain: Not hard at all  . Food insecurity:    Worry: Never true    Inability: Never true  . Transportation needs:    Medical: No    Non-medical: No  Tobacco Use  . Smoking status: Never Smoker  . Smokeless tobacco: Never Used  Substance and Sexual Activity  . Alcohol use: Never    Frequency: Never  . Drug use: Never  .  Sexual activity: Not on file  Lifestyle  . Physical activity:    Days per week: 0 days    Minutes per session: 0 min  . Stress: Not at all  Relationships  . Social connections:    Talks on phone: More than three times a week    Gets together: More than three times a week    Attends religious service: More than 4 times per year    Active member of club or organization: No    Attends meetings of clubs or organizations: Never    Relationship status: Living with partner  Other Topics Concern  . Not on file  Social History Narrative   Attends church and sees friends or family often    Tobacco Counseling Counseling given: Not Answered   Clinical Intake:  Pre-visit preparation completed: Yes  Pain : No/denies pain Pain Score: 0-No pain Pain Type: Acute pain Pain Location: Head Pain Orientation: Posterior Pain Descriptors / Indicators: Aching Pain Onset: More than a month ago Pain Frequency: Intermittent Pain Relieving Factors: BC's  Pain Relieving Factors: BC's  Nutritional Status: BMI 25 -29 Overweight Nutritional Risks: None Diabetes: No  How often do you need to have someone help you when you read instructions, pamphlets, or other written materials from your doctor or pharmacy?: 1 - Never What is the last grade level you completed  in school?: special education high school   Interpreter Needed?: No  Information entered by :: Peter Stoermer,LPN   Activities of Daily Living In your present state of health, do you have any difficulty performing the following activities: 01/05/2019 06/30/2018  Hearing? Y Y  Comment deaf in left ear, declines hearing aids  left ear  Vision? N N  Comment no glasses or eye doctor  -  Difficulty concentrating or making decisions? N N  Walking or climbing stairs? N N  Dressing or bathing? N N  Doing errands, shopping? N N  Preparing Food and eating ? N -  Using the Toilet? N -  In the past six months, have you accidently leaked urine? N -    Do you have problems with loss of bowel control? N -  Managing your Medications? N -  Managing your Finances? N -  Housekeeping or managing your Housekeeping? N -     Immunizations and Health Maintenance Immunization History  Administered Date(s) Administered  . Influenza,inj,Quad PF,6+ Mos 08/31/2018  . Tdap 06/30/2018   There are no preventive care reminders to display for this patient.  Patient Care Team: Smitty Cords, DO as PCP - General (Family Medicine)  Indicate any recent Medical Services you may have received from other than [facility] providers in the past year (date may be approximate).    Assessment:   This is a routine wellness examination for Peter Mcmahon.  Hearing/Vision screen No exam data present  Dietary issues and exercise activities discussed: Current Exercise Habits: Home exercise routine, Type of exercise: strength training/weights, Frequency (Times/Week): 2, Intensity: Mild, Exercise limited by: None identified  Goals   None    Depression Screen PHQ 2/9 Scores 01/05/2019 08/10/2018 06/30/2018  PHQ - 2 Score 0 0 -  Exception Documentation - - Patient refusal    Fall Risk Fall Risk  01/05/2019 08/10/2018  Falls in the past year? 0 No  Number falls in past yr: 0 -  Injury with Fall? 0 -    FALL RISK PREVENTION PERTAINING TO THE HOME:  Any stairs in or around the home? No If so, are there any without handrails? n/a  Home free of loose throw rugs in walkways, pet beds, electrical cords, etc? Yes  Adequate lighting in your home to reduce risk of falls? Yes   ASSISTIVE DEVICES UTILIZED TO PREVENT FALLS:  Life alert? No  Use of a cane, walker or w/c? No  Grab bars in the bathroom? No  Shower chair or bench in shower? No  Elevated toilet seat or a handicapped toilet? No   DME ORDERS:  DME order needed?  No   TIMED UP AND GO:  Was the test performed? Yes .  Length of time to ambulate 10 feet: 10 sec.   GAIT:  Appearance of gait: Gait  stead-fast without the use of an assistive device.  Education: Fall risk prevention has been discussed.  Intervention(s) required? No    Cognitive Function:     6CIT Screen 01/05/2019  What Year? 0 points  What month? 0 points  What time? 0 points  Count back from 20 0 points  Months in reverse 0 points  Repeat phrase 2 points  Total Score 2    Screening Tests Health Maintenance  Topic Date Due  . TETANUS/TDAP  06/30/2028  . INFLUENZA VACCINE  Completed  . HIV Screening  Completed    Qualifies for Shingles Vaccine? No    Tdap: up to date   Flu  Vaccine: up to date   Pneumococcal Vaccine: not indicated   Cancer Screenings:  Colorectal Screening: due at age 21 due to father having colon cancer.    Lung Cancer Screening: (Low Dose CT Chest recommended if Age 57-80 years, 30 pack-year currently smoking OR have quit w/in 15years.) does not qualify.     Additional Screening:  Hepatitis C Screening: does not qualify  Vision Screening: Recommended annual ophthalmology exams for early detection of glaucoma and other disorders of the eye. Is the patient up to date with their annual eye exam?  No  Who is the provider or what is the name of the office in which the pt attends annual eye exams? n/a If pt is not established with a provider, would they like to be referred to a provider to establish care? No .   Dental Screening: Recommended annual dental exams for proper oral hygiene  Community Resource Referral:  CRR required this visit?  No       Plan:    I have personally reviewed and addressed the Medicare Annual Wellness questionnaire and have noted the following in the patient's chart:  A. Medical and social history B. Use of alcohol, tobacco or illicit drugs  C. Current medications and supplements D. Functional ability and status E.  Nutritional status F.  Physical activity G. Advance directives H. List of other physicians I.  Hospitalizations, surgeries,  and ER visits in previous 12 months J.  Vitals K. Screenings such as hearing and vision if needed, cognitive and depression L. Referrals and appointments   In addition, I have reviewed and discussed with patient certain preventive protocols, quality metrics, and best practice recommendations. A written personalized care plan for preventive services as well as general preventive health recommendations were provided to patient.   Signed,  Marin Roberts, LPN Nurse Health Advisor   Nurse Notes:none

## 2019-02-08 ENCOUNTER — Encounter: Payer: Self-pay | Admitting: Family Medicine

## 2019-02-08 ENCOUNTER — Other Ambulatory Visit: Payer: Self-pay

## 2019-02-08 ENCOUNTER — Ambulatory Visit (INDEPENDENT_AMBULATORY_CARE_PROVIDER_SITE_OTHER): Payer: Medicare Other | Admitting: Family Medicine

## 2019-02-08 ENCOUNTER — Other Ambulatory Visit: Payer: Self-pay | Admitting: Family Medicine

## 2019-02-08 VITALS — BP 155/98 | HR 78 | Temp 98.4°F | Resp 16 | Ht 64.0 in | Wt 144.8 lb

## 2019-02-08 DIAGNOSIS — R7303 Prediabetes: Secondary | ICD-10-CM

## 2019-02-08 DIAGNOSIS — I10 Essential (primary) hypertension: Secondary | ICD-10-CM

## 2019-02-08 DIAGNOSIS — E7800 Pure hypercholesterolemia, unspecified: Secondary | ICD-10-CM

## 2019-02-08 DIAGNOSIS — Z0000 Encounter for general adult medical examination without abnormal findings: Secondary | ICD-10-CM

## 2019-02-08 LAB — POCT GLYCOSYLATED HEMOGLOBIN (HGB A1C): HEMOGLOBIN A1C: 5.9 % — AB (ref 4.0–5.6)

## 2019-02-08 MED ORDER — AMLODIPINE BESYLATE 10 MG PO TABS: ORAL_TABLET | ORAL | 1 refills | Status: DC

## 2019-02-08 NOTE — Assessment & Plan Note (Addendum)
Persistent elevated BP in office and at home reading / also with headache as symptom New diagnosis HTN No known complications     Plan:  1. START new med Amlodipine 10mg  tablets - initial dose is HALF tab = 5mg  daily up to 1-2 weeks, if needed can increase up to  2. Encourage improved lifestyle - low sodium diet, regular exercise 3. Continue monitor BP outside office, bring readings to next visit, if persistently >140/90 or new symptoms notify office sooner 4. Follow-up 6 months for annual physical w/ labs

## 2019-02-08 NOTE — Assessment & Plan Note (Signed)
Mild elevated A1c from 5.6 up to 5.9  Plan:  1. Not on any therapy currently  2. Encourage improved lifestyle - low carb, low sugar diet, reduce portion size, continue improving regular exercise - handout on diet given 3. Follow-up 6 mo A1c with labs

## 2019-02-08 NOTE — Progress Notes (Signed)
Subjective:    Patient ID: Peter Mcmahon, male    DOB: January 27, 1976, 43 y.o.   MRN: [id number]  Peter Mcmahon is a 43 y.o. male presenting on 02/08/2019 for Hypertension  Patient provides limited verbal history due to Cognitive Disability, primarily history is provided by patient's friend and roommate / caregiver - Peter Mcmahon here today.  HPI  Pre-Diabetes Last result A1c 5.6. Now today due for A1c Not checking CBG Meds:Never on med Lifestyle: - Family history of Diabetes - Diet (Still trying to improve low carb diet - admits he hassweet tooth eats some sweets but overall still improved, drinks some koolaid and juice, less water- does some home cooking) - Exercise (activity, some walking) Denies hypoglycemia, polyuria, visual changes, numbness or tingling.  CHRONIC HTN, New Diagnosis Reports prior history of elevated BP in past. Has BP cuff, readings are episodic elevated >140-150 at times Current Meds - None never on med   Fam history mother and grandmother on mom side HTN - sister Admits headache, posteriorly Denies CP, dyspnea, edema, dizziness / lightheadedness   Health Maintenance:  Due for Flu Shot, declines today despite counseling on benefits  UTD routine HIV screen, negative  Colon CA Screening: Never had colonoscopy.Currently asymptomatic.Known family history of colon CA, father age >39.Peter Mcmahon due for screening testage >45 will place referral in future for GI   Depression screen [facility] 2/9 02/08/2019 01/05/2019 08/10/2018  Decreased Interest 0 0 0  Down, Depressed, Hopeless 0 0 0  PHQ - 2 Score 0 0 0    Social History   Tobacco Use  . Smoking status: Never Smoker  . Smokeless tobacco: Never Used  Substance Use Topics  . Alcohol use: Never    Frequency: Never  . Drug use: Never    Review of Systems Per HPI unless specifically indicated above     Objective:    BP (!) 155/98   Pulse 78   Temp 98.4 F (36.9 C) (Oral)   Resp 16   Ht 5\' 4"   (1.626 m)   Wt 144 lb 12.8 oz (65.7 kg)   BMI 24.85 kg/m   Wt Readings from Last 3 Encounters:  02/08/19 144 lb 12.8 oz (65.7 kg)  01/05/19 152 lb (68.9 kg)  08/10/18 146 lb (66.2 kg)    Physical Exam Vitals signs and nursing note reviewed.  Constitutional:      General: He is not in acute distress.    Appearance: He is well-developed. He is not diaphoretic.     Comments: Well-appearing, comfortable, cooperative  HENT:     Head: Normocephalic and atraumatic.  Eyes:     General:        Right eye: No discharge.        Left eye: No discharge.     Conjunctiva/sclera: Conjunctivae normal.  Cardiovascular:     Rate and Rhythm: Normal rate.  Pulmonary:     Effort: Pulmonary effort is normal.  Skin:    General: Skin is warm and dry.     Findings: No erythema or rash.  Neurological:     Mental Status: He is alert and oriented to person, place, and time.  Psychiatric:        Behavior: Behavior normal.     Comments: Well groomed, good eye contact, speech is slowed at times, and often has difficulty following conversation, thoughts are appropriate but simple, has difficulty comprehending more complex thoughts and conversation. Follows commands well. Pleasant.      Recent Labs  08/03/18 0955 02/08/19 1112  HGBA1C 5.6 5.9*    Results for orders placed or performed in visit on 02/08/19  POCT HgB A1C  Result Value Ref Range   Hemoglobin A1C 5.9 (A) 4.0 - 5.6 %      Assessment & Plan:   Problem List Items Addressed This Visit    Essential hypertension    Persistent elevated BP in office and at home reading / also with headache as symptom New diagnosis HTN No known complications     Plan:  1. START new med Amlodipine 10mg  tablets - initial dose is HALF tab = 5mg  daily up to 1-2 weeks, if needed can increase up to  2. Encourage improved lifestyle - low sodium diet, regular exercise 3. Continue monitor BP outside office, bring readings to next visit, if persistently  >140/90 or new symptoms notify office sooner 4. Follow-up 6 months for annual physical w/ labs      Relevant Medications   amLODipine (NORVASC) 10 MG tablet   Pre-diabetes - Primary    Mild elevated A1c from 5.6 up to 5.9  Plan:  1. Not on any therapy currently  2. Encourage improved lifestyle - low carb, low sugar diet, reduce portion size, continue improving regular exercise - handout on diet given 3. Follow-up 6 mo A1c with labs      Relevant Orders   POCT HgB A1C (Completed)      Meds ordered this encounter  Medications  . amLODipine (NORVASC) 10 MG tablet    Sig: Start with half tablet 5mg  daily for 1-2 weeks then increase to 1 whole tab daily if needed in future    Dispense:  90 tablet    Refill:  1     Follow up plan: Return in about 6 months (around 08/11/2019) for Annual Physical.  Future labs ordered for 08/09/19  Peter Pilar, DO [facility] 02/08/2019, 11:06 AM

## 2019-02-08 NOTE — Patient Instructions (Addendum)
Thank you for coming to the office today.  Recent Labs    08/03/18 0955 02/08/19 1112  HGBA1C 5.6 5.9*   Start with HALF pill Amlodipine 10mg  - for blood pressure and headaches - every day in morning, then can check BP 10am to 12noon - daily for first week then space it out and check only few times a week.  If still elevated BP after 1-2 weeks on half pill - go ahead and take ONE WHOLE pill for 10mg  daily  Keep in mind we can talk again by phone to adjust med or discuss BP readings in 2-4 weeks if needed  ----------  DUE for FASTING BLOOD WORK (no food or drink after midnight before the lab appointment, only water or coffee without cream/sugar on the morning of)  SCHEDULE "Lab Only" visit in the morning at the clinic for lab draw in 6 MONTHS   - Make sure Lab Only appointment is at about 1 week before your next appointment, so that results will be available  For Lab Results, once available within 2-3 days of blood draw, you can can log in to MyChart online to view your results and a brief explanation. Also, we can discuss results at next follow-up visit.   Please schedule a Follow-up Appointment to: Return in about 6 months (around 08/11/2019) for Annual Physical.  If you have any other questions or concerns, please feel free to call the office or send a message through MyChart. You may also schedule an earlier appointment if necessary.  Additionally, you may be receiving a survey about your experience at our office within a few days to 1 week by e-mail or mail. We value your feedback.  Saralyn Pilar, DO [facility], [place]

## 2019-04-14 ENCOUNTER — Emergency Department
Admission: EM | Admit: 2019-04-14 | Discharge: 2019-04-15 | Disposition: A | Discharge status: Home / Self Care | Payer: Medicare Other | Attending: Emergency Medicine | Admitting: Emergency Medicine

## 2019-04-14 ENCOUNTER — Emergency Department: Payer: Medicare Other

## 2019-04-14 ENCOUNTER — Encounter: Payer: Self-pay | Admitting: Emergency Medicine

## 2019-04-14 ENCOUNTER — Other Ambulatory Visit: Payer: Self-pay

## 2019-04-14 DIAGNOSIS — X500XXA Overexertion from strenuous movement or load, initial encounter: Secondary | ICD-10-CM | POA: Diagnosis not present

## 2019-04-14 DIAGNOSIS — Y9389 Activity, other specified: Secondary | ICD-10-CM | POA: Diagnosis not present

## 2019-04-14 DIAGNOSIS — Y999 Unspecified external cause status: Secondary | ICD-10-CM | POA: Insufficient documentation

## 2019-04-14 DIAGNOSIS — T148XXA Other injury of unspecified body region, initial encounter: Secondary | ICD-10-CM

## 2019-04-14 DIAGNOSIS — Y929 Unspecified place or not applicable: Secondary | ICD-10-CM | POA: Diagnosis not present

## 2019-04-14 DIAGNOSIS — Z79899 Other long term (current) drug therapy: Secondary | ICD-10-CM | POA: Insufficient documentation

## 2019-04-14 DIAGNOSIS — I10 Essential (primary) hypertension: Secondary | ICD-10-CM | POA: Diagnosis not present

## 2019-04-14 DIAGNOSIS — S39012A Strain of muscle, fascia and tendon of lower back, initial encounter: Secondary | ICD-10-CM | POA: Insufficient documentation

## 2019-04-14 DIAGNOSIS — S3992XA Unspecified injury of lower back, initial encounter: Secondary | ICD-10-CM | POA: Diagnosis present

## 2019-04-14 MED ORDER — BACLOFEN 5 MG PO TABS: 5.0000 mg | ORAL_TABLET | Freq: Two times a day (BID) | ORAL | 0 refills | Status: DC | PRN

## 2019-04-14 MED ORDER — ORPHENADRINE CITRATE 30 MG/ML IJ SOLN
60.0000 mg | Freq: Two times a day (BID) | INTRAMUSCULAR | Status: DC
  Administered 2019-04-14: 60 mg via INTRAMUSCULAR
  Filled 2019-04-14: qty 2

## 2019-04-14 MED ORDER — MELOXICAM 7.5 MG PO TABS
15.0000 mg | ORAL_TABLET | Freq: Once | ORAL | Status: AC
  Administered 2019-04-14: 15 mg via ORAL
  Filled 2019-04-14: qty 2

## 2019-04-14 MED ORDER — MELOXICAM 7.5 MG PO TABS: 7.5000 mg | ORAL_TABLET | Freq: Every day | ORAL | 0 refills | Status: DC

## 2019-04-14 NOTE — ED Provider Notes (Signed)
[facility] Emergency Department Provider Note  ____________________________________________  Time seen: Approximately 11:34 PM  I have reviewed the triage vital signs and the nursing notes.   HISTORY  Chief Complaint Back Pain    HPI Peter Mcmahon is a 43 y.o. male that presents to the emergency department for evaluation of right low back pain after moving a lawnmower onto a van on Sunday.  Patient states that he has had some pain to his right low back since.  Pain does not radiate.  No abdominal pain.  No bowel or bladder dysfunction or saddle anesthesias.  No leg pain.  No numbness, tingling.   History reviewed. No pertinent past medical history.  Patient Active Problem List   Diagnosis Date Noted  . Hypercholesteremia 08/10/2018  . Essential hypertension 08/10/2018  . Pre-diabetes 06/30/2018  . Psoriasis 06/30/2018  . Intellectual disability 06/30/2018    History reviewed. No pertinent surgical history.  Prior to Admission medications   Medication Sig Start Date End Date Taking? Authorizing Provider  amLODipine (NORVASC) 10 MG tablet Start with half tablet 5mg  daily for 1-2 weeks then increase to 1 whole tab daily if needed in future 02/08/19   Althea Charon, Netta Neat, DO  Baclofen 5 MG TABS Take 5 mg by mouth 2 (two) times daily as needed. 04/14/19   Enid Derry, PA-C  meloxicam (MOBIC) 7.5 MG tablet Take 1 tablet (7.5 mg total) by mouth daily. 04/14/19 04/13/20  Enid Derry, PA-C    Allergies Patient has no known allergies.  Family History  Problem Relation Age of Onset  . Diabetes Father   . Colon cancer Father   . Hypertension Mother   . Diabetes Sister   . Diabetes Brother   . Diabetes Paternal Uncle   . Diabetes Paternal Grandfather   . Prostate cancer Neg Hx     Social History Social History   Tobacco Use  . Smoking status: Never Smoker  . Smokeless tobacco: Never Used  Substance Use Topics  . Alcohol use: Never     Frequency: Never  . Drug use: Never     Review of Systems  Respiratory: No SOB. Gastrointestinal: No abdominal pain.  No nausea, no vomiting.  Musculoskeletal: Positive for low back pain. Skin: Negative for rash, abrasions, lacerations, ecchymosis. Neurological: Negative for  numbness or tingling   ____________________________________________   PHYSICAL EXAM:  VITAL SIGNS: ED Triage Vitals  Enc Vitals Group     BP 04/14/19 2121 (!) 148/86     Pulse Rate 04/14/19 2121 (!) 101     Resp 04/14/19 2121 18     Temp 04/14/19 2121 99.3 F (37.4 C)     Temp Source 04/14/19 2121 Oral     SpO2 04/14/19 2121 100 %     Weight 04/14/19 2125 145 lb (65.8 kg)     Height 04/14/19 2125 5\' 4"  (1.626 m)     Head Circumference --      Peak Flow --      Pain Score 04/14/19 2125 10     Pain Loc --      Pain Edu? --      Excl. in GC? --      Constitutional: Alert and oriented. Well appearing and in no acute distress. Eyes: Conjunctivae are normal. PERRL. EOMI. Head: Atraumatic. ENT:      Ears:      Nose: No congestion/rhinnorhea.      Mouth/Throat: Mucous membranes are moist.  Neck: No stridor.  Cardiovascular: Normal rate,  regular rhythm.  Good peripheral circulation. Respiratory: Normal respiratory effort without tachypnea or retractions. Lungs CTAB. Good air entry to the bases with no decreased or absent breath sounds. Musculoskeletal: Full range of motion to all extremities. No gross deformities appreciated.  No tenderness to palpation to lumbar spine.  Mild tenderness to palpation to right lumbar paraspinal muscles.  Strength equal in lower extremities bilaterally. Normal gait. Neurologic:  Normal speech and language. No gross focal neurologic deficits are appreciated.  Skin:  Skin is warm, dry and intact. No rash noted. Psychiatric: Mood and affect are normal. Speech and behavior are normal. Patient exhibits appropriate insight and  judgement.   ____________________________________________   LABS (all labs ordered are listed, but only abnormal results are displayed)  Labs Reviewed - No data to display ____________________________________________  EKG   ____________________________________________  RADIOLOGY Lexine BatonI, Yanelli Zapanta, personally viewed and evaluated these images (plain radiographs) as part of my medical decision making, as well as reviewing the written report by the radiologist.  Dg Lumbar Spine 2-3 Views  Result Date: 04/14/2019 CLINICAL DATA:  Low back pain following heavy lifting 1 week ago, initial encounter EXAM: LUMBAR SPINE - 3 VIEW COMPARISON:  None. FINDINGS: Six non rib-bearing lumbar type vertebral bodies are well visualized. Vertebral body height is well maintained. Mild osteophytic changes are seen. Disc space narrowing at the lumbosacral junction is noted. No soft tissue abnormality is seen. IMPRESSION: Mild degenerative change without acute abnormality. Electronically Signed   By: Alcide CleverMark  Lukens M.D.   On: 04/14/2019 22:28    ____________________________________________    PROCEDURES  Procedure(s) performed:    Procedures    Medications  orphenadrine (NORFLEX) injection 60 mg (60 mg Intramuscular Given 04/14/19 2325)  meloxicam (MOBIC) tablet 15 mg (15 mg Oral Given 04/14/19 2325)     ____________________________________________   INITIAL IMPRESSION / ASSESSMENT AND PLAN / ED COURSE  Pertinent labs & imaging results that were available during my care of the patient were reviewed by me and considered in my medical decision making (see chart for details).  Review of the [place] CSRS was performed in accordance of the [facility] prior to dispensing any controlled drugs.     Patient's diagnosis is consistent with muscle strain.  Vital signs and exam are reassuring.  X-ray negative for acute bony abnormalities.  Patient was given IM Norflex and oral Mobic for pain.  Patient will be  discharged home with prescriptions for baclofen and Mobic. Patient is to follow up with primary care as directed. Patient is given ED precautions to return to the ED for any worsening or new symptoms.     ____________________________________________  FINAL CLINICAL IMPRESSION(S) / ED DIAGNOSES  Final diagnoses:  Muscle strain      NEW MEDICATIONS STARTED DURING THIS VISIT:  ED Discharge Orders         Ordered    Baclofen 5 MG TABS  2 times daily PRN     04/14/19 2343    meloxicam (MOBIC) 7.5 MG tablet  Daily     04/14/19 2343              This chart was dictated using voice recognition software/Dragon. Despite best efforts to proofread, errors can occur which can change the meaning. Any change was purely unintentional.    Enid DerryWagner, Kayci [place], PA-C 04/15/19 0008    Arnaldo NatalMalinda, Paul F, MD 04/15/19 [phone]

## 2019-04-14 NOTE — ED Notes (Signed)
Reports tried to pick up a lawn mower last Sunday, and has been having lower back pain since.

## 2019-04-14 NOTE — ED Triage Notes (Signed)
Pt in via [facility], complaints of lower back pain since Sunday.  Pt states, "I lifted a lawn mower into a van, I think I lifted too much weight."  NAD noted at this time.

## 2019-04-15 NOTE — ED Notes (Signed)
Patient instructed to pick up medications from [facility] and follow up as needed. Patient ambulatory at discharge

## 2019-04-20 ENCOUNTER — Encounter: Payer: Self-pay | Admitting: Family Medicine

## 2019-04-20 ENCOUNTER — Ambulatory Visit (INDEPENDENT_AMBULATORY_CARE_PROVIDER_SITE_OTHER): Payer: Medicare Other | Admitting: Family Medicine

## 2019-04-20 ENCOUNTER — Other Ambulatory Visit: Payer: Self-pay

## 2019-04-20 DIAGNOSIS — S39012A Strain of muscle, fascia and tendon of lower back, initial encounter: Secondary | ICD-10-CM | POA: Diagnosis not present

## 2019-04-20 DIAGNOSIS — M47816 Spondylosis without myelopathy or radiculopathy, lumbar region: Secondary | ICD-10-CM

## 2019-04-20 MED ORDER — BACLOFEN 10 MG PO TABS: 5.0000 mg | ORAL_TABLET | Freq: Three times a day (TID) | ORAL | 0 refills | Status: DC | PRN

## 2019-04-20 MED ORDER — MELOXICAM 15 MG PO TABS: 15.0000 mg | ORAL_TABLET | Freq: Every day | ORAL | 0 refills | Status: DC

## 2019-04-20 NOTE — Progress Notes (Signed)
Virtual Visit via Telephone The purpose of this virtual visit is to provide medical care while limiting exposure to the novel coronavirus (COVID19) for both patient and office staff.  Consent was obtained for phone visit:  Yes.   Answered questions that patient had about telehealth interaction:  Yes.   I discussed the limitations, risks, security and privacy concerns of performing an evaluation and management service by telephone. I also discussed with the patient that there may be a patient responsible charge related to this service. The patient expressed understanding and agreed to proceed.  Patient Location: Home Provider Location: [facility])  ---------------------------------------------------------------------- Chief Complaint  Patient presents with  . Hospitalization Follow-up    lower back pain    S: Reviewed CMA documentation. I have called patient and gathered additional HPI as follows:  Patient provides limited verbal history due to Cognitive Disability, primarily history is provided by patient's friend and roommate / caregiver - [name] here today.  ED FOLLOW-UP VISIT  Hospital/Location: [facility] Date of ED Visit: 04/14/19  Reason for Presenting to ED: acute back pain with muscle strain Primary (+Secondary) Diagnosis: acute back strain, low back pain, DJD lumbar spine  FOLLOW-UP - ED provider note and record have been reviewed - Patient presents today about 6 days after recent ED visit. Brief summary of recent course, patient had symptoms of acute back pain, presented to ED on 5/27, after sprained back helping with heavy lifting, testing in ED with X-ray lumbar arthritis see below, treated with injection muscle relaxant and rx baclofen 5 and meloxicam 7.5. - Today reports overall has done well after discharge from ED. Symptoms of low back pain have slightly improved still persistent, less than 1 week, occasionally difficult at night with pain.  - New  medications on discharge: baclofen, meloxicam  Denies any fevers/chills, numbness, tingling, weakness, loss of control bladder/bowel incontinence or retention, unintentional wt loss, night sweats   FOR A&P I have reviewed the discharge medication list, and have reconciled the current and discharge medications today.    History reviewed. No pertinent past medical history. Social History   Tobacco Use  . Smoking status: Never Smoker  . Smokeless tobacco: Never Used  Substance Use Topics  . Alcohol use: Never    Frequency: Never  . Drug use: Never    Current Outpatient Medications:  .  amLODipine (NORVASC) 10 MG tablet, Start with half tablet 5mg  daily for 1-2 weeks then increase to 1 whole tab daily if needed in future, Disp: 90 tablet, Rfl: 1 .  baclofen (LIORESAL) 10 MG tablet, Take 0.5-1 tablets (5-10 mg total) by mouth 3 (three) times daily as needed for muscle spasms., Disp: 30 each, Rfl: 0 .  meloxicam (MOBIC) 15 MG tablet, Take 1 tablet (15 mg total) by mouth daily. For 1-2 weeks then as needed, Disp: 30 tablet, Rfl: 0  Depression screen [facility] 2/9 04/20/2019 02/08/2019 01/05/2019  Decreased Interest 0 0 0  Down, Depressed, Hopeless 0 0 0  PHQ - 2 Score 0 0 0    No flowsheet data found.  -------------------------------------------------------------------------- O: No physical exam performed due to remote telephone encounter.  Lab results reviewed.  Recent Results (from the past 2160 hour(s))  POCT HgB A1C     Status: Abnormal   Collection Time: 02/08/19 11:12 AM  Result Value Ref Range   Hemoglobin A1C 5.9 (A) 4.0 - 5.6 %   I have personally reviewed the radiology report from 04/14/19 Lumbar spine X-ray.  CLINICAL DATA:  Low back pain following heavy lifting 1 week ago, initial encounter  EXAM: LUMBAR SPINE - 3 VIEW  COMPARISON: None.  FINDINGS: Six non rib-bearing lumbar type vertebral bodies are well visualized. Vertebral body height is well maintained.  Mild osteophytic changes are seen. Disc space narrowing at the lumbosacral junction is noted. No soft tissue abnormality is seen.  IMPRESSION: Mild degenerative change without acute abnormality.   Electronically Signed By: Alcide CleverMark Lukens M.D. On: 04/14/2019 22:28   -------------------------------------------------------------------------- A&P:  Problem List Items Addressed This Visit    None    Visit Diagnoses    Acute myofascial strain of lumbar region, initial encounter    -  Primary   Relevant Medications   meloxicam (MOBIC) 15 MG tablet   baclofen (LIORESAL) 10 MG tablet   Spondylosis of lumbar region without myelopathy or radiculopathy       Relevant Medications   meloxicam (MOBIC) 15 MG tablet   baclofen (LIORESAL) 10 MG tablet     Acute LBP without sciatica. Suspect likely due to muscle spasm/strain, with known lifting injury  No prior known OA/DJD, but identified on X-ray - No red flag symptoms - Inadequate conservative therapy - temporary relief on baclofen/NSAID  Plan: 1. INCREASE dose NSAID Meloxicam from 7.5 to 15mg  daily wc for 1-2 weeks then PRN 2. Increase Baclofen 5 to 10mg  tabs - take 5-10mg  up to TID PRN, titrate up as tolerated - caution sedation 3. May use Tylenol PRN for breakthrough 4. Encouraged use of heating pad 1-2x daily for now then PRN 5. Follow-up 4-6 weeks if not improved for re-evaluation, consider X-ray imaging, trial of PT, and possibly referral to Orthopedic   Meds ordered this encounter  Medications  . meloxicam (MOBIC) 15 MG tablet    Sig: Take 1 tablet (15 mg total) by mouth daily. For 1-2 weeks then as needed    Dispense:  30 tablet    Refill:  0  . baclofen (LIORESAL) 10 MG tablet    Sig: Take 0.5-1 tablets (5-10 mg total) by mouth 3 (three) times daily as needed for muscle spasms.    Dispense:  30 each    Refill:  0    Follow-up: - Return in 4-6 weeks back pain if not improved  Patient verbalizes understanding with the  above medical recommendations including the limitation of remote medical advice.  Specific follow-up and call-back criteria were given for patient to follow-up or seek medical care more urgently if needed.   - Time spent in direct consultation with patient on phone: 11 minutes  Saralyn PilarAlexander [name], DO [facility] 04/20/2019, 3:01 PM

## 2019-04-20 NOTE — Patient Instructions (Addendum)
AVS info given by phone. No MyChart access 

## 2019-08-09 ENCOUNTER — Other Ambulatory Visit: Payer: Medicare Other

## 2019-08-09 ENCOUNTER — Other Ambulatory Visit: Payer: Self-pay

## 2019-08-09 DIAGNOSIS — E7800 Pure hypercholesterolemia, unspecified: Secondary | ICD-10-CM

## 2019-08-09 DIAGNOSIS — I10 Essential (primary) hypertension: Secondary | ICD-10-CM

## 2019-08-09 DIAGNOSIS — Z0000 Encounter for general adult medical examination without abnormal findings: Secondary | ICD-10-CM

## 2019-08-09 DIAGNOSIS — R7303 Prediabetes: Secondary | ICD-10-CM

## 2019-08-10 LAB — COMPLETE METABOLIC PANEL WITH GFR
AG Ratio: 1.3 (calc) (ref 1.0–2.5)
ALT: 34 U/L (ref 9–46)
AST: 27 U/L (ref 10–40)
Albumin: 4.6 g/dL (ref 3.6–5.1)
Alkaline phosphatase (APISO): 70 U/L (ref 36–130)
BUN: 13 mg/dL (ref 7–25)
CO2: 26 mmol/L (ref 20–32)
Calcium: 9.9 mg/dL (ref 8.6–10.3)
Chloride: 105 mmol/L (ref 98–110)
Creat: 0.98 mg/dL (ref 0.60–1.35)
GFR, Est African American: 107 mL/min/{1.73_m2} (ref >=60)
GFR, Est Non African American: 93 mL/min/{1.73_m2} (ref >=60)
Globulin: 3.5 g/dL (calc) (ref 1.9–3.7)
Glucose, Bld: 103 mg/dL — ABNORMAL HIGH (ref 65–99)
Potassium: 4.3 mmol/L (ref 3.5–5.3)
Sodium: 142 mmol/L (ref 135–146)
Total Bilirubin: 0.6 mg/dL (ref 0.2–1.2)
Total Protein: 8.1 g/dL (ref 6.1–8.1)

## 2019-08-10 LAB — LIPID PANEL
Cholesterol: 279 mg/dL — ABNORMAL HIGH (ref <200)
HDL: 64 mg/dL (ref >=40)
LDL Cholesterol (Calc): 187 mg/dL (calc) — ABNORMAL HIGH
Non-HDL Cholesterol (Calc): 215 mg/dL (calc) — ABNORMAL HIGH (ref <130)
Total CHOL/HDL Ratio: 4.4 (calc) (ref <5.0)
Triglycerides: 138 mg/dL (ref <150)

## 2019-08-10 LAB — CBC WITH DIFFERENTIAL/PLATELET
Absolute Monocytes: 515 cells/uL (ref 200–950)
Basophils Absolute: 31 cells/uL (ref 0–200)
Basophils Relative: 0.6 %
Eosinophils Absolute: 57 cells/uL (ref 15–500)
Eosinophils Relative: 1.1 %
HCT: 43.2 % (ref 38.5–50.0)
Hemoglobin: 14.7 g/dL (ref 13.2–17.1)
Lymphs Abs: 2064 cells/uL (ref 850–3900)
MCH: 31.7 pg (ref 27.0–33.0)
MCHC: 34 g/dL (ref 32.0–36.0)
MCV: 93.3 fL (ref 80.0–100.0)
MPV: 11.9 fL (ref 7.5–12.5)
Monocytes Relative: 9.9 %
Neutro Abs: 2532 cells/uL (ref 1500–7800)
Neutrophils Relative %: 48.7 %
Platelets: 187 10*3/uL (ref 140–400)
RBC: 4.63 10*6/uL (ref 4.20–5.80)
RDW: 13.8 % (ref 11.0–15.0)
Total Lymphocyte: 39.7 %
WBC: 5.2 10*3/uL (ref 3.8–10.8)

## 2019-08-10 LAB — HEMOGLOBIN A1C
Hgb A1c MFr Bld: 5.6 % of total Hgb (ref <5.7)
Mean Plasma Glucose: 114 (calc)
eAG (mmol/L): 6.3 (calc)

## 2019-08-13 ENCOUNTER — Other Ambulatory Visit: Payer: Self-pay

## 2019-08-13 ENCOUNTER — Encounter: Payer: Self-pay | Admitting: Family Medicine

## 2019-08-13 ENCOUNTER — Ambulatory Visit (INDEPENDENT_AMBULATORY_CARE_PROVIDER_SITE_OTHER): Payer: Medicare Other | Admitting: Family Medicine

## 2019-08-13 VITALS — BP 135/89 | HR 77 | Temp 98.3°F | Resp 16 | Ht 64.0 in | Wt 143.0 lb

## 2019-08-13 DIAGNOSIS — Z0000 Encounter for general adult medical examination without abnormal findings: Secondary | ICD-10-CM | POA: Diagnosis not present

## 2019-08-13 DIAGNOSIS — N50812 Left testicular pain: Secondary | ICD-10-CM

## 2019-08-13 DIAGNOSIS — R7303 Prediabetes: Secondary | ICD-10-CM

## 2019-08-13 DIAGNOSIS — Z23 Encounter for immunization: Secondary | ICD-10-CM

## 2019-08-13 DIAGNOSIS — Z1211 Encounter for screening for malignant neoplasm of colon: Secondary | ICD-10-CM

## 2019-08-13 DIAGNOSIS — F79 Unspecified intellectual disabilities: Secondary | ICD-10-CM

## 2019-08-13 DIAGNOSIS — I10 Essential (primary) hypertension: Secondary | ICD-10-CM

## 2019-08-13 DIAGNOSIS — E7800 Pure hypercholesterolemia, unspecified: Secondary | ICD-10-CM

## 2019-08-13 MED ORDER — AMLODIPINE BESYLATE 10 MG PO TABS: 10.0000 mg | ORAL_TABLET | Freq: Every day | ORAL | 3 refills | Status: DC

## 2019-08-13 NOTE — Assessment & Plan Note (Signed)
Improved A1c back to 5.6 W/ improved lifestyle diet, exercise Follow-up q 6 month

## 2019-08-13 NOTE — Assessment & Plan Note (Signed)
Improved, now mostly controlled HTN on amlodipine since last visit No known complications     Plan:  1. Continue Amlodipine 10mg  daily 2. Encourage improved lifestyle - low sodium diet, regular exercise 3. Continue monitor BP outside office, bring readings to next visit, if persistently >140/90 or new symptoms notify office sooner 4. Follow-up 6 months

## 2019-08-13 NOTE — Progress Notes (Signed)
Subjective:    Patient ID: Peter Mcmahon, male    DOB: 1976/03/18, 43 y.o.   MRN: [id number]  Peter Mcmahon is a 43 y.o. male presenting on 08/13/2019 for Annual Exam  Patient provides limited verbal history due to Cognitive Disability, primarily history is provided by patient's friend and roommate / caregiver - [name] here today.  HPI  Pre-Diabetes Last result 5.9, now lab improved to A1c 5.6 Not checking CBG Meds:Never on med Lifestyle: - Family history of Diabetes - Diet (Still trying to improve low carb diet now less sweets, caregiver helps reduce his sweets, he drinks more water now, less juice) - Exercise (activity, some walking) Denies hypoglycemia, polyuria, visual changes, numbness or tingling.  CHRONIC HTN Reports prior history of elevated BP in past. Last visit had new dx HTN - started on amlodipine Today doing better Current Meds - Amlodipine 10mg  daily Fam history mother and grandmother on mom side HTN - sister Reduced headaches Denies CP, dyspnea, edema, dizziness / lightheadedness  HYPERLIPIDEMIA: - Last lipid panel 07/2019, elevated LDL - Not on statin therapy See above lifestyle   Additional complaint:  Groin Pain / Testicular  Chronic episodic groin pain for 4-5 years, difficult to describe, seems to be localized to Left testicle. Not every day, sometimes worse with position change. Denies any swelling redness or bruising, bulging hernia or urinary symptoms  Health Maintenance:  Due Flu shot will get today.  UTD routine HIV screen, negative  Colon CA Screening: Never had colonoscopy.Currently asymptomatic.Known family history of colon CA, father age >21.Nearly due for screening testage >45 will place referral in future for GI   Depression screen [facility] 2/9 08/13/2019 04/20/2019 02/08/2019  Decreased Interest 0 0 0  Down, Depressed, Hopeless 0 0 0  PHQ - 2 Score 0 0 0    History reviewed. No pertinent past medical history. History  reviewed. No pertinent surgical history. Social History   Socioeconomic History  . Marital status: Significant Other    Spouse name: Not on file  . Number of children: Not on file  . Years of education: Not on file  . Highest education level: High school graduate  Occupational History  . Occupation: disability   Social Needs  . Financial resource strain: Not hard at all  . Food insecurity    Worry: Never true    Inability: Never true  . Transportation needs    Medical: No    Non-medical: No  Tobacco Use  . Smoking status: Never Smoker  . Smokeless tobacco: Never Used  Substance and Sexual Activity  . Alcohol use: Never    Frequency: Never  . Drug use: Never  . Sexual activity: Not on file  Lifestyle  . Physical activity    Days per week: 0 days    Minutes per session: 0 min  . Stress: Not at all  Relationships  . Social connections    Talks on phone: More than three times a week    Gets together: More than three times a week    Attends religious service: More than 4 times per year    Active member of club or organization: No    Attends meetings of clubs or organizations: Never    Relationship status: Living with partner  . Intimate partner violence    Fear of current or ex partner: No    Emotionally abused: No    Physically abused: No    Forced sexual activity: No  Other Topics Concern  . Not  on file  Social History Narrative   Attends church and sees friends or family often    Family History  Problem Relation Age of Onset  . Diabetes Father   . Colon cancer Father   . Hypertension Mother   . Diabetes Sister   . Diabetes Brother   . Diabetes Paternal Uncle   . Diabetes Paternal Grandfather   . Prostate cancer Neg Hx    No current outpatient medications on file prior to visit.   No current facility-administered medications on file prior to visit.     Review of Systems  Constitutional: Negative for activity change, appetite change, chills, diaphoresis,  fatigue and fever.  HENT: Negative for congestion and hearing loss.   Eyes: Negative for visual disturbance.  Respiratory: Negative for apnea, cough, choking, chest tightness, shortness of breath and wheezing.   Cardiovascular: Negative for chest pain, palpitations and leg swelling.  Gastrointestinal: Negative for abdominal pain, anal bleeding, blood in stool, constipation, diarrhea, nausea and vomiting.  Endocrine: Negative for cold intolerance and polyuria.  Genitourinary: Positive for testicular pain (left chronic). Negative for decreased urine volume, difficulty urinating, dysuria, frequency, hematuria, penile swelling, scrotal swelling and urgency.  Musculoskeletal: Negative for arthralgias, back pain and neck pain.  Skin: Negative for rash.  Allergic/Immunologic: Negative for environmental allergies.  Neurological: Negative for dizziness, weakness, light-headedness, numbness and headaches.  Hematological: Negative for adenopathy.  Psychiatric/Behavioral: Negative for behavioral problems, dysphoric mood and sleep disturbance. The patient is not nervous/anxious.    Per HPI unless specifically indicated above      Objective:    BP 135/89   Pulse 77   Temp 98.3 F (36.8 C) (Oral)   Resp 16   Ht 5\' 4"  (1.626 m)   Wt 143 lb (64.9 kg)   BMI 24.55 kg/m   Wt Readings from Last 3 Encounters:  08/13/19 143 lb (64.9 kg)  04/14/19 145 lb (65.8 kg)  02/08/19 144 lb 12.8 oz (65.7 kg)    Physical Exam Vitals signs and nursing note reviewed.  Constitutional:      General: He is not in acute distress.    Appearance: He is well-developed. He is not diaphoretic.     Comments: Well-appearing, comfortable, cooperative  HENT:     Head: Normocephalic and atraumatic.  Eyes:     General:        Right eye: No discharge.        Left eye: No discharge.     Conjunctiva/sclera: Conjunctivae normal.     Pupils: Pupils are equal, round, and reactive to light.  Neck:     Musculoskeletal: Normal  range of motion and neck supple.     Thyroid: No thyromegaly.  Cardiovascular:     Rate and Rhythm: Normal rate and regular rhythm.     Heart sounds: Normal heart sounds. No murmur.  Pulmonary:     Effort: Pulmonary effort is normal. No respiratory distress.     Breath sounds: Normal breath sounds. No wheezing or rales.  Abdominal:     General: Bowel sounds are normal. There is no distension.     Palpations: Abdomen is soft. There is no mass.     Tenderness: There is no abdominal tenderness.  Genitourinary:    Penis: Normal.      Scrotum/Testes: Normal.     Comments: Mild reproduced discomfort on palpation of Left testicle middle to lower pole, otherwise no abnormal structures identified, no swelling or other deformity Musculoskeletal: Normal range of motion.  General: No tenderness.     Comments: Upper / Lower Extremities: - Normal muscle tone, strength bilateral upper extremities 5/5, lower extremities 5/5  Lymphadenopathy:     Cervical: No cervical adenopathy.  Skin:    General: Skin is warm and dry.     Findings: No erythema or rash.  Neurological:     Mental Status: He is alert and oriented to person, place, and time.     Comments: Distal sensation intact to light touch all extremities  Psychiatric:        Behavior: Behavior normal.     Comments: Well groomed, good eye contact, normal speech and thoughts      Recent Labs    02/08/19 1112 08/09/19 0803  HGBA1C 5.9* 5.6     Results for orders placed or performed in visit on 08/09/19  Lipid panel  Result Value Ref Range   Cholesterol 279 (H) <200 mg/dL   HDL 64 > OR = 40 mg/dL   Triglycerides 138 <150 mg/dL   LDL Cholesterol (Calc) 187 (H) mg/dL (calc)   Total CHOL/HDL Ratio 4.4 <5.0 (calc)   Non-HDL Cholesterol (Calc) 215 (H) <130 mg/dL (calc)  COMPLETE METABOLIC PANEL WITH GFR  Result Value Ref Range   Glucose, Bld 103 (H) 65 - 99 mg/dL   BUN 13 7 - 25 mg/dL   Creat 0.98 0.60 - 1.35 mg/dL   GFR, Est  Non African American 93 > OR = 60 mL/min/1.34m2   GFR, Est African American 107 > OR = 60 mL/min/1.29m2   BUN/Creatinine Ratio NOT APPLICABLE 6 - 22 (calc)   Sodium 142 135 - 146 mmol/L   Potassium 4.3 3.5 - 5.3 mmol/L   Chloride 105 98 - 110 mmol/L   CO2 26 20 - 32 mmol/L   Calcium 9.9 8.6 - 10.3 mg/dL   Total Protein 8.1 6.1 - 8.1 g/dL   Albumin 4.6 3.6 - 5.1 g/dL   Globulin 3.5 1.9 - 3.7 g/dL (calc)   AG Ratio 1.3 1.0 - 2.5 (calc)   Total Bilirubin 0.6 0.2 - 1.2 mg/dL   Alkaline phosphatase (APISO) 70 36 - 130 U/L   AST 27 10 - 40 U/L   ALT 34 9 - 46 U/L  CBC with Differential/Platelet  Result Value Ref Range   WBC 5.2 3.8 - 10.8 Thousand/uL   RBC 4.63 4.20 - 5.80 Million/uL   Hemoglobin 14.7 13.2 - 17.1 g/dL   HCT 43.2 38.5 - 50.0 %   MCV 93.3 80.0 - 100.0 fL   MCH 31.7 27.0 - 33.0 pg   MCHC 34.0 32.0 - 36.0 g/dL   RDW 13.8 11.0 - 15.0 %   Platelets 187 140 - 400 Thousand/uL   MPV 11.9 7.5 - 12.5 fL   Neutro Abs 2,532 1,500 - 7,800 cells/uL   Lymphs Abs 2,064 850 - 3,900 cells/uL   Absolute Monocytes 515 200 - 950 cells/uL   Eosinophils Absolute 57 15 - 500 cells/uL   Basophils Absolute 31 0 - 200 cells/uL   Neutrophils Relative % 48.7 %   Total Lymphocyte 39.7 %   Monocytes Relative 9.9 %   Eosinophils Relative 1.1 %   Basophils Relative 0.6 %  Hemoglobin A1c  Result Value Ref Range   Hgb A1c MFr Bld 5.6 <5.7 % of total Hgb   Mean Plasma Glucose 114 (calc)   eAG (mmol/L) 6.3 (calc)      Assessment & Plan:   Problem List Items Addressed This Visit  Essential hypertension    Improved, now mostly controlled HTN on amlodipine since last visit No known complications     Plan:  1. Continue Amlodipine  daily 2. Encourage improved lifestyle - low sodium diet, regular exercise 3. Continue monitor BP outside office, bring readings to next visit, if persistently >140/90 or new symptoms notify office sooner 4. Follow-up 6 months      Relevant Medications    amLODipine (NORVASC) 10 MG tablet   Hypercholesteremia    Uncontrolled cholesterol elevated LDL - despite improved lifestyle  Plan: 1. ASCVD risk not indicated for statin at this time 2. Encourage improved lifestyle - low carb/cholesterol, reduce portion size, continue improving regular exercise Follow-up yearly      Relevant Medications   amLODipine (NORVASC) 10 MG tablet   Intellectual disability    Stable chronic problem Functions well especially with assistance of friend and caregiver      Pre-diabetes    Improved A1c back to 5.6 W/ improved lifestyle diet, exercise Follow-up q 6 month       Other Visit Diagnoses    Annual physical exam    -  Primary   Needs flu shot       Relevant Orders   Flu Vaccine QUAD 36+ mos IM (Completed)   Left testicular pain       Relevant Orders   Ambulatory referral to Urology   Colon cancer screening       Relevant Orders   Cologuard      #Testicular pain Referral to [facility] for chronic Left testicular pain >4-5 years without traumatic injury or other known cause, seems to be episodic, without redness or swelling, benign exam by PCP without nodule or mass, but it is reproducible. Patient has some cognitive impairment, caregiver provides history. Requesting eval by Urology  #Colon cancer screen Due for routine colon cancer screening. Never had colonoscopy (not interested), no family history colon cancer. - Discussion today about recommendations for either Colonoscopy or Cologuard screening, benefits and risks of screening, interested in Cologuard, understands that if positive then recommendation is for diagnostic colonoscopy to follow-up. - Ordered Cologuard today   Orders Placed This Encounter  Procedures  . Flu Vaccine QUAD 36+ mos IM  . Cologuard  . Ambulatory referral to Urology    Referral Priority:   Routine    Referral Type:   Consultation    Referral Reason:   Specialty Services Required     Requested Specialty:   Urology    Number of Visits Requested:   1     Meds ordered this encounter  Medications  . amLODipine (NORVASC) 10 MG tablet    Sig: Take 1 tablet (10 mg total) by mouth daily.    Dispense:  90 tablet    Refill:  3    Follow up plan: Return in about 6 months (around 02/10/2020) for 6 month PreDM A1c.  Saralyn Pilar, DO [facility] 08/13/2019, 9:10 AM

## 2019-08-13 NOTE — Assessment & Plan Note (Signed)
Uncontrolled cholesterol elevated LDL - despite improved lifestyle  Plan: 1. ASCVD risk not indicated for statin at this time 2. Encourage improved lifestyle - low carb/cholesterol, reduce portion size, continue improving regular exercise Follow-up yearly

## 2019-08-13 NOTE — Assessment & Plan Note (Signed)
Stable chronic problem Functions well especially with assistance of friend and caregiver 

## 2019-08-13 NOTE — Patient Instructions (Addendum)
Thank you for coming to the office today.  Call by next week if not heard back yet on apt  [place] -[address] [place],  [place]  [address] Phone: [phone]   Recent Labs    02/08/19 1112 08/09/19 0803  HGBA1C 5.9* 5.6    Colon Cancer Screening: - For all adults age 45+ routine colon cancer screening is highly recommended.     - Recent guidelines from [place] recommend starting age of 70 - Early detection of colon cancer is important, because often there are no warning signs or symptoms, also if found early usually it can be cured. Late stage is hard to treat.  - If you are not interested in Colonoscopy screening (if done and normal you could be cleared for 5 to 10 years until next due), then Cologuard is an excellent alternative for screening test for Colon Cancer. It is highly sensitive for detecting DNA of colon cancer from even the earliest stages. Also, there is NO bowel prep required. - If Cologuard is NEGATIVE, then it is good for 3 years before next due - If Cologuard is POSITIVE, then it is strongly advised to get a Colonoscopy, which allows the GI doctor to locate the source of the cancer or polyp (even very early stage) and treat it by removing it. ------------------------- If you would like to proceed with Cologuard (stool DNA test) - FIRST, call your insurance company and tell them you want to check cost of Cologuard tell them CPT Code [phone] (it may be completely covered and you could get for no cost, OR max cost without any coverage is about $600). Also, keep in mind if you do NOT open the kit, and decide not to do the test, you will NOT be charged, you should contact the company if you decide not to do the test.  ORDERED TEST The test kit will be delivered to you house within about 1 week. Follow instructions to collect sample, you may call the company for any help or questions, 24/7  telephone support at [phone].   Please schedule a Follow-up Appointment to: Return in about 6 months (around 02/10/2020) for 6 month PreDM A1c.  If you have any other questions or concerns, please feel free to call the office or send a message through [facility]. You may also schedule an earlier appointment if necessary.  Additionally, you may be receiving a survey about your experience at our office within a few days to 1 week by e-mail or mail. We value your feedback.  Nobie Putnam, DO [place] [facility]

## 2019-09-06 ENCOUNTER — Telehealth: Payer: Self-pay | Admitting: Family Medicine

## 2019-09-06 NOTE — Telephone Encounter (Signed)
Pt called said that ins will not pay for cologaurd testing until his is 89.Pt wanted to know what to do.

## 2019-09-06 NOTE — Telephone Encounter (Signed)
Can you call Peter Mcmahon from Cologuard to see if he can help?  Phone [phone]  I am not sure exactly what information he would need, but as far as I understand Cologuard should be covered age 45+  This patient's specific history  - Age 43 (DOB 11/26/1975) - Never had colonoscopy - Currently asymptomatic - Known family history of colon CA, father age >28  Let me know if he has other information for [place] - he may need top check with the specific insurance plan to find out more information.  Peter Mcmahon, [facility] 09/06/2019, 4:46 PM

## 2019-09-07 NOTE — Telephone Encounter (Signed)
Left message

## 2019-09-08 NOTE — Telephone Encounter (Signed)
Peter Mcmahon had suggested that patient can call [phone] and they can make 3 way conference call and help him get approved and even if it is not approved then he cal call Cologuard with claim number and they can help him get reimbursed. Patient was informed and he will them and insurance company.

## 2019-09-16 ENCOUNTER — Ambulatory Visit: Payer: Medicare Other | Admitting: Urology

## 2019-10-01 ENCOUNTER — Ambulatory Visit (INDEPENDENT_AMBULATORY_CARE_PROVIDER_SITE_OTHER): Payer: Medicare Other | Admitting: Urology

## 2019-10-01 ENCOUNTER — Encounter: Payer: Self-pay | Admitting: Urology

## 2019-10-01 ENCOUNTER — Other Ambulatory Visit: Payer: Self-pay

## 2019-10-01 VITALS — BP 116/75 | HR 90 | Ht 64.0 in | Wt 145.0 lb

## 2019-10-01 DIAGNOSIS — N5082 Scrotal pain: Secondary | ICD-10-CM | POA: Diagnosis not present

## 2019-10-01 NOTE — Progress Notes (Signed)
10/01/2019 10:09 AM   Raquel Sarna Bebe Liter 04/03/1976 [id number]  Referring provider: Olin Hauser, DO [address] [place],  [place] [address]  Chief Complaint  Patient presents with  . Testicle Pain    HPI: Peter Mcmahon is a 43 y.o. male seen in consultation at the request of Dr. Parks Ranger for evaluation of chronic scrotal content pain.  He has a cognitive disability and is accompanied today by his friend/roommate/caregiver.  He has a 4-5-year history of intermittent scrotal pain which is bilateral.  It was difficult to discern if he has daily pain.  His testes seem to be more sensitive with touch.  No voiding complaints, history of trauma or prior urologic history.  Pain severity rated mild-moderate.  Denies dysuria or gross hematuria.  PMH: No past medical history on file.  Surgical History: No past surgical history on file.  Home Medications:  Allergies as of 10/01/2019   No Known Allergies     Medication List       Accurate as of October 01, 2019 10:09 AM. If you have any questions, ask your nurse or doctor.        amLODipine 10 MG tablet Commonly known as: NORVASC Take 1 tablet (10 mg total) by mouth daily.       Allergies: No Known Allergies  Family History: Family History  Problem Relation Age of Onset  . Diabetes Father   . Colon cancer Father   . Hypertension Mother   . Diabetes Sister   . Diabetes Brother   . Diabetes Paternal Uncle   . Diabetes Paternal Grandfather   . Prostate cancer Neg Hx     Social History:  reports that he has never smoked. He has never used smokeless tobacco. He reports that he does not drink alcohol or use drugs.  ROS: UROLOGY Frequent Urination?: No Hard to postpone urination?: No Burning/pain with urination?: No Get up at night to urinate?: No Leakage of urine?: No Urine stream starts and stops?: No Trouble starting stream?: No Do you have to strain to urinate?: No Blood in urine?: No Urinary tract  infection?: No Sexually transmitted disease?: No Injury to kidneys or bladder?: No Painful intercourse?: No Weak stream?: No Erection problems?: No Penile pain?: No  Gastrointestinal Nausea?: No Vomiting?: No Indigestion/heartburn?: No Diarrhea?: No Constipation?: No  Constitutional Fever: No Night sweats?: No Weight loss?: No Fatigue?: No  Skin Skin rash/lesions?: No Itching?: No  Eyes Blurred vision?: No Double vision?: No  Ears/Nose/Throat Sore throat?: No Sinus problems?: No  Hematologic/Lymphatic Swollen glands?: No Easy bruising?: No  Cardiovascular Leg swelling?: No Chest pain?: No  Respiratory Cough?: No Shortness of breath?: No  Endocrine Excessive thirst?: No  Musculoskeletal Back pain?: No Joint pain?: No  Neurological Headaches?: No Dizziness?: No  Psychologic Depression?: No Anxiety?: No  Physical Exam: BP 116/75   Pulse 90   Ht 5\' 4"  (1.626 m)   Wt 145 lb (65.8 kg)   BMI 24.89 kg/m   Constitutional:  Alert, No acute distress. HEENT: [place] AT, moist mucus membranes.  Trachea midline, no masses. Cardiovascular: No clubbing, cyanosis, or edema. Respiratory: Normal respiratory effort, no increased work of breathing. GI: Abdomen is soft, nontender, nondistended, no abdominal masses GU: Phallus circumcised without lesions, testes descended bilaterally and of normal size.  No palpable masses.  Mild bilateral tenderness.  Spermatic cord/epididymis palpably normal bilaterally.  No palpable inguinal hernia. Lymph: No cervical or inguinal lymphadenopathy. Skin: No rashes, bruises or suspicious lesions. Neurologic: Grossly intact,  no focal deficits, moving all 4 extremities. Psychiatric: Normal mood and affect.   Assessment & Plan:   43 y.o. male with chronic scrotal content pain which appears to be mild.  Will schedule a scrotal sonogram and they will be contacted with the results.  If negative sonogram and symptoms are bothersome  enough that medication is desired will give a trial of gabapentin or amitriptyline.   Riki Altes, MD  [facility] [address], [address] [place], [place] [address] [phone]

## 2019-10-11 ENCOUNTER — Other Ambulatory Visit: Payer: Self-pay | Admitting: Urology

## 2019-10-11 DIAGNOSIS — N5082 Scrotal pain: Secondary | ICD-10-CM

## 2019-12-08 ENCOUNTER — Telehealth: Payer: Self-pay | Admitting: Family Medicine

## 2019-12-08 NOTE — Telephone Encounter (Signed)
I called to ask about changing 01/11/2020 AWV to virtual, but there was no answer and no vm.

## 2019-12-13 ENCOUNTER — Telehealth: Payer: Self-pay | Admitting: Family Medicine

## 2019-12-13 NOTE — Telephone Encounter (Signed)
I left a message explaining that we need to change to 01/11/20 AWV visit to a virtual visit, and I asked for a call back.

## 2020-01-11 ENCOUNTER — Ambulatory Visit (INDEPENDENT_AMBULATORY_CARE_PROVIDER_SITE_OTHER): Payer: Medicare Other

## 2020-01-11 DIAGNOSIS — Z0000 Encounter for general adult medical examination without abnormal findings: Secondary | ICD-10-CM

## 2020-01-11 DIAGNOSIS — Z1211 Encounter for screening for malignant neoplasm of colon: Secondary | ICD-10-CM

## 2020-01-11 NOTE — Progress Notes (Signed)
Subjective:   Peter Mcmahon is a 44 y.o. male who presents for Medicare Annual/Subsequent preventive examination.  This visit is being conducted via phone call  - after an attmept to do on video chat - due to the COVID-19 pandemic. This patient has given me verbal consent via phone to conduct this visit, patient states they are participating from their home address. Some vital signs may be absent or patient reported.   Patient identification: identified by name, DOB, and current address.   Peter Mcmahon helped with AWV today.   Review of Systems:   Cardiac Risk Factors include: hypertension     Objective:    Vitals: There were no vitals taken for this visit.  There is no height or weight on file to calculate BMI.  Advanced Directives 01/11/2020 04/14/2019 01/05/2019  Does Patient Have a Medical Advance Directive? No No No  Would patient like information on creating a medical advance directive? - No - Patient declined Yes (MAU/Ambulatory/Procedural Areas - Information given)    Tobacco Social History   Tobacco Use  Smoking Status Never Smoker  Smokeless Tobacco Never Used     Counseling given: Not Answered   Clinical Intake:  Pre-visit preparation completed: Yes  Pain : No/denies pain     Nutritional Risks: None Diabetes: No  How often do you need to have someone help you when you read instructions, pamphlets, or other written materials from your doctor or pharmacy?: 1 - Never  Interpreter Needed?: No  Information entered by :: Peter Cupples,LPN  Past Medical History:  Diagnosis Date  . Hypertension    History reviewed. No pertinent surgical history. Family History  Problem Relation Age of Onset  . Diabetes Father   . Colon cancer Father   . Hypertension Mother   . Diabetes Sister   . Diabetes Brother   . Diabetes Paternal Uncle   . Diabetes Paternal Grandfather   . Prostate cancer Neg Hx    Social History   Socioeconomic History  . Marital  status: Significant Other    Spouse name: Not on file  . Number of children: Not on file  . Years of education: Not on file  . Highest education level: High school graduate  Occupational History  . Occupation: disability   Tobacco Use  . Smoking status: Never Smoker  . Smokeless tobacco: Never Used  Substance and Sexual Activity  . Alcohol use: Never  . Drug use: Never  . Sexual activity: Not on file  Other Topics Concern  . Not on file  Social History Narrative   Attends church and sees friends or family often    Social Determinants of Health   Financial Resource Strain:   . Difficulty of Paying Living Expenses: Not on file  Food Insecurity:   . Worried About Programme researcher, broadcasting/film/video in the Last Year: Not on file  . Ran Out of Food in the Last Year: Not on file  Transportation Needs:   . Lack of Transportation (Medical): Not on file  . Lack of Transportation (Non-Medical): Not on file  Physical Activity:   . Days of Exercise per Week: Not on file  . Minutes of Exercise per Session: Not on file  Stress:   . Feeling of Stress : Not on file  Social Connections:   . Frequency of Communication with Friends and Family: Not on file  . Frequency of Social Gatherings with Friends and Family: Not on file  . Attends Religious Services: Not on file  .  Active Member of Clubs or Organizations: Not on file  . Attends Banker Meetings: Not on file  . Marital Status: Not on file    Outpatient Encounter Medications as of 01/11/2020  Medication Sig  . amLODipine (NORVASC) 10 MG tablet Take 1 tablet (10 mg total) by mouth daily.   No facility-administered encounter medications on file as of 01/11/2020.    Activities of Daily Living In your present state of health, do you have any difficulty performing the following activities: 01/11/2020  Hearing? N  Comment no hearing aids  Vision? N  Comment no eyeglasses, no eye dr  Difficulty concentrating or making decisions? N    Walking or climbing stairs? N  Dressing or bathing? N  Doing errands, shopping? Y  Comment has Network engineer and eating ? N  Using the Toilet? N  In the past six months, have you accidently leaked urine? N  Do you have problems with loss of bowel control? N  Managing your Medications? Y  Comment has help  Managing your Finances? Y  Comment has help  Housekeeping or managing your Housekeeping? Y  Comment has help  Some recent data might be hidden    Patient Care Team: Smitty Cords, DO as PCP - General (Family Medicine)   Assessment:   This is a routine wellness examination for [name].  Exercise Activities and Dietary recommendations Current Exercise Habits: Home exercise routine, Type of exercise: strength training/weights, Time (Minutes): 30, Frequency (Times/Week): 5, Weekly Exercise (Minutes/Week): 150, Intensity: Mild, Exercise limited by: None identified  Goals   None     Fall Risk: Fall Risk  01/11/2020 08/13/2019 04/20/2019 02/08/2019 01/05/2019  Falls in the past year? 0 0 0 0 0  Number falls in past yr: 0 - - - 0  Injury with Fall? 0 - - - 0  Follow up - Falls evaluation completed Falls evaluation completed Falls evaluation completed -    FALL RISK PREVENTION PERTAINING TO THE HOME:  Any stairs in or around the home? No  If so, are there any without handrails? No   Home free of loose throw rugs in walkways, pet beds, electrical cords, etc? Yes  Adequate lighting in your home to reduce risk of falls? Yes   ASSISTIVE DEVICES UTILIZED TO PREVENT FALLS:  Life alert? No  Use of a cane, walker or w/c? No  Grab bars in the bathroom? No  Shower chair or bench in shower? No  Elevated toilet seat or a handicapped toilet? No   TIMED UP AND GO:  Unable to perform   Depression Screen PHQ 2/9 Scores 01/11/2020 08/13/2019 04/20/2019 02/08/2019  PHQ - 2 Score 0 0 0 0  Exception Documentation - - - -    Cognitive Function     6CIT Screen  01/05/2019  What Year? 0 points  What month? 0 points  What time? 0 points  Count back from 20 0 points  Months in reverse 0 points  Repeat phrase 2 points  Total Score 2    Immunization History  Administered Date(s) Administered  . Influenza,inj,Quad PF,6+ Mos 08/31/2018, 08/13/2019  . Tdap 06/30/2018    Qualifies for Shingles Vaccine? No   Tdap: up to date .  Flu Vaccine: up to date   Pneumococcal Vaccine: not indicated    Screening Tests Health Maintenance  Topic Date Due  . TETANUS/TDAP  06/30/2028  . INFLUENZA VACCINE  Completed  . HIV Screening  Completed   Cancer Screenings:  Colorectal Screening: Referral to GI placed , didn't qualify for cologaurd since he has immediate family history.   Lung Cancer Screening: (Low Dose CT Chest recommended if Age 85-80 years, 30 pack-year currently smoking OR have quit w/in 15years.) does not qualify.     Additional Screening:  Hepatitis C Screening: does not qualify  Vision Screening: Recommended annual ophthalmology exams for early detection of glaucoma and other disorders of the eye. Is the patient up to date with their annual eye exam?  No   Dental Screening: Recommended annual dental exams for proper oral hygiene  Community Resource Referral:  CRR required this visit?  No        Plan:  I have personally reviewed and addressed the Medicare Annual Wellness questionnaire and have noted the following in the patient's chart:  A. Medical and social history B. Use of alcohol, tobacco or illicit drugs  C. Current medications and supplements D. Functional ability and status E.  Nutritional status F.  Physical activity G. Advance directives H. List of other physicians I.  Hospitalizations, surgeries, and ER visits in previous 12 months J.  [place] such as hearing and vision if needed, cognitive and depression L. Referrals and appointments   In addition, I have reviewed and discussed with patient  certain preventive protocols, quality metrics, and best practice recommendations. A written personalized care plan for preventive services as well as general preventive health recommendations were provided to patient.   Signed,   Bevelyn Ngo, LPN  [phone]/[id number] Nurse Health Advisor   Nurse Notes: none

## 2020-01-17 ENCOUNTER — Telehealth: Payer: Self-pay | Admitting: Gastroenterology

## 2020-01-17 NOTE — Telephone Encounter (Signed)
Pt fiance left vm returning a call to schedule a colonoscopy

## 2020-01-17 NOTE — Telephone Encounter (Signed)
Returned call.  LVM for pt to call office to schedule colonoscopy.  Thanks,  [place], [place]

## 2020-02-14 ENCOUNTER — Encounter: Payer: Self-pay | Admitting: Family Medicine

## 2020-02-14 ENCOUNTER — Other Ambulatory Visit: Payer: Self-pay

## 2020-02-14 ENCOUNTER — Other Ambulatory Visit: Payer: Self-pay | Admitting: Family Medicine

## 2020-02-14 ENCOUNTER — Ambulatory Visit (INDEPENDENT_AMBULATORY_CARE_PROVIDER_SITE_OTHER): Payer: Medicare Other | Admitting: Family Medicine

## 2020-02-14 VITALS — BP 134/80 | HR 83 | Temp 97.3°F | Resp 16 | Ht 64.0 in | Wt 142.0 lb

## 2020-02-14 DIAGNOSIS — E7800 Pure hypercholesterolemia, unspecified: Secondary | ICD-10-CM

## 2020-02-14 DIAGNOSIS — M545 Low back pain: Secondary | ICD-10-CM

## 2020-02-14 DIAGNOSIS — I10 Essential (primary) hypertension: Secondary | ICD-10-CM

## 2020-02-14 DIAGNOSIS — R7303 Prediabetes: Secondary | ICD-10-CM

## 2020-02-14 DIAGNOSIS — F79 Unspecified intellectual disabilities: Secondary | ICD-10-CM | POA: Diagnosis not present

## 2020-02-14 DIAGNOSIS — Z0000 Encounter for general adult medical examination without abnormal findings: Secondary | ICD-10-CM

## 2020-02-14 DIAGNOSIS — G8929 Other chronic pain: Secondary | ICD-10-CM

## 2020-02-14 LAB — POCT GLYCOSYLATED HEMOGLOBIN (HGB A1C): Hemoglobin A1C: 5.7 % — AB (ref 4.0–5.6)

## 2020-02-14 MED ORDER — BACLOFEN 5 MG PO TABS: 5.0000 mg | ORAL_TABLET | Freq: Two times a day (BID) | ORAL | 2 refills | Status: DC | PRN

## 2020-02-14 MED ORDER — MELOXICAM 7.5 MG PO TABS: 7.5000 mg | ORAL_TABLET | Freq: Every day | ORAL | 2 refills | Status: DC | PRN

## 2020-02-14 NOTE — Patient Instructions (Addendum)
Thank you for coming to the office today.  Recent Labs    08/09/19 0803 02/14/20 1104  HGBA1C 5.6 5.7*   Body Armor Drinks - regular - do 1 a day (half and half) Body Armor Lyte drink - can do up to 2 bottles a day  BP seems well controlled.  Start taking Baclofen (Lioresal) 10mg  (muscle relaxant) - start with half (cut) to one whole pill at night as needed for next 1-3 nights (may make you drowsy, caution with driving) see how it affects you, then if tolerated increase to one pill 2 to 3 times a day or (every 8 hours as needed)  Use Meloxicam as well for anti inflammatory once a day as needed only for a few days at a time if need  DUE for FASTING BLOOD WORK (no food or drink after midnight before the lab appointment, only water or coffee without cream/sugar on the morning of)  SCHEDULE "Lab Only" visit in the morning at the clinic for lab draw in 6 MONTHS   - Make sure Lab Only appointment is at about 1 week before your next appointment, so that results will be available  For Lab Results, once available within 2-3 days of blood draw, you can can log in to MyChart online to view your results and a brief explanation. Also, we can discuss results at next follow-up visit.   Please schedule a Follow-up Appointment to: Return in about 6 months (around 08/16/2020) for Annual Physical.  If you have any other questions or concerns, please feel free to call the office or send a message through MyChart. You may also schedule an earlier appointment if necessary.  Additionally, you may be receiving a survey about your experience at our office within a few days to 1 week by e-mail or mail. We value your feedback.  08/18/2020, DO [facility], [facility]

## 2020-02-14 NOTE — Assessment & Plan Note (Signed)
Improved, now mostly controlled HTN on amlodipine No known complications     Plan:  1. Continue Amlodipine 10mg daily 2. Encourage improved lifestyle - low sodium diet, regular exercise 3. Continue monitor BP outside office, bring readings to next visit, if persistently >140/90 or new symptoms notify office sooner 4. Follow-up 6 months 

## 2020-02-14 NOTE — Assessment & Plan Note (Signed)
Mostly well-controlled Pre-DM with A1c 5.7 now, stable from 5.6 to 5.7 HTN HLD  Plan:  1. Not on any therapy currently  2. Encourage improved lifestyle - low carb, low sugar diet, reduce portion size, continue improving regular exercise - Reviewed his consumption of Body Armor Drinks reviewed food label, advise drink 1 drink a day max, half at one serving instead of 3 a day, or can try Lyte version for 1-2 a day

## 2020-02-14 NOTE — Progress Notes (Signed)
Subjective:    Patient ID: Peter Mcmahon, male    DOB: July 03, 1976, 44 y.o.   MRN: [id number]  Peter Mcmahon is a 44 y.o. male presenting on 02/14/2020 for Pre-Diabetes and Hypertension   HPI   Pre-Diabetes Last result 5.6 to 5.9, last result was 5.6 now up to 5.7 today Not checking CBG Meds:Never on med Lifestyle: - Family history of Diabetes - Diet (Still trying to improvelow carbdiet now less sweets, caregiver helps reduce his sweets, he drinks Body Armor Drinks, they need to check food label) - Exercise (activity, some walking) Denies hypoglycemia, polyuria, visual changes, numbness or tingling.  CHRONIC HTN Reportsprior history of elevated BP in past. Home BP readings are under control, doing well. Today doing better Current Meds -Amlodipine 10mg  daily Fam history mother and grandmother on mom side HTN - sister Denies CP, dyspnea, edema, dizziness / lightheadedness  History of Back Pain Flare up Last treatment back in May 2020 at ED given baclofen and meloxicam PRN. These helped, out of meds now. - can occasionally get back pain flare up,   Health Maintenance:  Colon CA Screening: Never had colonoscopy.Currently asymptomatic.Known family history of colon CA, father age >19.Nearly due for screening testage >45 / now awaiting scheduling  Depression screen [facility] 2/9 02/14/2020 01/11/2020 08/13/2019  Decreased Interest 0 0 0  Down, Depressed, Hopeless 0 0 0  PHQ - 2 Score 0 0 0    Social History   Tobacco Use  . Smoking status: Never Smoker  . Smokeless tobacco: Never Used  Substance Use Topics  . Alcohol use: Never  . Drug use: Never    Review of Systems Per HPI unless specifically indicated above     Objective:    BP 134/80   Pulse 83   Temp (!) 97.3 F (36.3 C) (Temporal)   Resp 16   Ht 5\' 4"  (1.626 m)   Wt 142 lb (64.4 kg)   SpO2 100%   BMI 24.37 kg/m   Wt Readings from Last 3 Encounters:  02/14/20 142 lb (64.4 kg)  10/01/19 145 lb (65.8  kg)  08/13/19 143 lb (64.9 kg)    Physical Exam Vitals and nursing note reviewed.  Constitutional:      General: He is not in acute distress.    Appearance: He is well-developed. He is not diaphoretic.     Comments: Well-appearing, comfortable, cooperative  HENT:     Head: Normocephalic and atraumatic.  Eyes:     General:        Right eye: No discharge.        Left eye: No discharge.     Conjunctiva/sclera: Conjunctivae normal.  Cardiovascular:     Rate and Rhythm: Normal rate.  Pulmonary:     Effort: Pulmonary effort is normal.  Musculoskeletal:     Comments: Low Back Inspection: Normal appearance, no spinal deformity, symmetrical. Palpation: No tenderness over spinous processes. Bilateral lumbar paraspinal muscles non-tender but with mild hypertonicity ROM: Full active ROM forward flex / back extension, rotation L/R without discomfort Special Testing: Seated SLR negative for radicular pain bilaterally  Strength: Bilateral hip flex/ext 5/5, knee flex/ext 5/5, ankle dorsiflex/plantarflex 5/5 Neurovascular: intact distal sensation to light touch   Skin:    General: Skin is warm and dry.     Findings: No erythema or rash.  Neurological:     Mental Status: He is alert and oriented to person, place, and time.  Psychiatric:        Behavior: Behavior  normal.     Comments: Well groomed, good eye contact, normal speech and thoughts      Recent Labs    08/09/19 0803 02/14/20 1104  HGBA1C 5.6 5.7*    Results for orders placed or performed in visit on 02/14/20  POCT HgB A1C  Result Value Ref Range   Hemoglobin A1C 5.7 (A) 4.0 - 5.6 %      Assessment & Plan:   Problem List Items Addressed This Visit    Pre-diabetes - Primary    Mostly well-controlled Pre-DM with A1c 5.7 now, stable from 5.6 to 5.7 HTN HLD  Plan:  1. Not on any therapy currently  2. Encourage improved lifestyle - low carb, low sugar diet, reduce portion size, continue improving regular exercise -  Reviewed his consumption of Body Armor Drinks reviewed food label, advise drink 1 drink a day max, half at one serving instead of 3 a day, or can try Lyte version for 1-2 a day      Relevant Orders   POCT HgB A1C (Completed)   Intellectual disability   Essential hypertension    Improved, now mostly controlled HTN on amlodipine No known complications     Plan:  1. Continue Amlodipine 10mg  daily 2. Encourage improved lifestyle - low sodium diet, regular exercise 3. Continue monitor BP outside office, bring readings to next visit, if persistently >140/90 or new symptoms notify office sooner 4. Follow-up 6 months       Other Visit Diagnoses    Chronic bilateral low back pain without sciatica       Relevant Medications   Baclofen 5 MG TABS   meloxicam (MOBIC) 7.5 MG tablet    Subacute on chronic mid to low back pain without sciatica, secondary to muscle spasm and strain Previously improved on Meloxicam and baclofen in 2020 Re order meds counseling on proper intermittent usage Follow up if not improve >4 weeks can order imaging and f/u    Meds ordered this encounter  Medications  . Baclofen 5 MG TABS    Sig: Take 5 mg by mouth 2 (two) times daily as needed (back pain).    Dispense:  30 tablet    Refill:  2  . meloxicam (MOBIC) 7.5 MG tablet    Sig: Take 1 tablet (7.5 mg total) by mouth daily as needed for pain.    Dispense:  30 tablet    Refill:  2     Follow up plan: Return in about 6 months (around 08/16/2020) for Annual Physical.  Future labs ordered for 08/22/20  10/22/20, DO [facility] 02/14/2020, 11:12 AM

## 2020-08-22 ENCOUNTER — Encounter: Payer: Medicare Other | Admitting: Family Medicine

## 2020-09-27 ENCOUNTER — Encounter: Payer: Self-pay | Admitting: Family Medicine

## 2020-09-27 ENCOUNTER — Other Ambulatory Visit: Payer: Self-pay

## 2020-09-27 ENCOUNTER — Ambulatory Visit (INDEPENDENT_AMBULATORY_CARE_PROVIDER_SITE_OTHER): Payer: Medicare Other | Admitting: Family Medicine

## 2020-09-27 VITALS — BP 131/79 | HR 82 | Temp 97.3°F | Resp 16 | Ht 64.0 in | Wt 143.6 lb

## 2020-09-27 DIAGNOSIS — Z23 Encounter for immunization: Secondary | ICD-10-CM | POA: Diagnosis not present

## 2020-09-27 DIAGNOSIS — R7303 Prediabetes: Secondary | ICD-10-CM | POA: Diagnosis not present

## 2020-09-27 DIAGNOSIS — B351 Tinea unguium: Secondary | ICD-10-CM

## 2020-09-27 DIAGNOSIS — I10 Essential (primary) hypertension: Secondary | ICD-10-CM | POA: Diagnosis not present

## 2020-09-27 DIAGNOSIS — Z0000 Encounter for general adult medical examination without abnormal findings: Secondary | ICD-10-CM

## 2020-09-27 DIAGNOSIS — G8929 Other chronic pain: Secondary | ICD-10-CM

## 2020-09-27 DIAGNOSIS — E7800 Pure hypercholesterolemia, unspecified: Secondary | ICD-10-CM

## 2020-09-27 DIAGNOSIS — F79 Unspecified intellectual disabilities: Secondary | ICD-10-CM

## 2020-09-27 DIAGNOSIS — Z125 Encounter for screening for malignant neoplasm of prostate: Secondary | ICD-10-CM

## 2020-09-27 DIAGNOSIS — M545 Low back pain, unspecified: Secondary | ICD-10-CM

## 2020-09-27 MED ORDER — MELOXICAM 7.5 MG PO TABS: 7.5000 mg | ORAL_TABLET | Freq: Every day | ORAL | 3 refills | Status: DC | PRN

## 2020-09-27 MED ORDER — TERBINAFINE HCL 250 MG PO TABS: 250.0000 mg | ORAL_TABLET | Freq: Every day | ORAL | 2 refills | Status: DC

## 2020-09-27 MED ORDER — AMLODIPINE BESYLATE 5 MG PO TABS: 5.0000 mg | ORAL_TABLET | Freq: Every day | ORAL | 3 refills | Status: DC

## 2020-09-27 MED ORDER — BACLOFEN 5 MG PO TABS: 5.0000 mg | ORAL_TABLET | Freq: Two times a day (BID) | ORAL | 3 refills | Status: DC | PRN

## 2020-09-27 NOTE — Progress Notes (Signed)
Subjective:    Patient ID: Peter Mcmahon, male    DOB: 06/23/1976, 44 y.o.   MRN: [id number]  Peter Mcmahon is a 44 y.o. male presenting on 09/27/2020 for Annual Exam   HPI   Here for Annual Physical and Lab Review. With caregiver, [name]  Pre-Diabetes Last result 5.6 to 5.9, last result was 5.6 now up to 5.7 today Not checking CBG Meds:Never on med Lifestyle: - Family history of Diabetes - Diet (Still trying to improvelow carbdietnow less sweets, caregiver helps reduce his sweets, he drinks Body Armor Drinks, they need to check food label) - Exercise (activity, some walking) Denies hypoglycemia, polyuria, visual changes, numbness or tingling.  CHRONIC HTN Reportsprior history of elevated BP in past. Home BP readings are under control, doing well. Today doing better on lower dose med Current Meds -Amlodipine 5mg  daily Fam history mother and grandmother on mom side HTN - sister Denies CP, dyspnea, edema, dizziness / lightheadedness  History of Back Pain Flare up Last treatment back in May 2020 at ED given baclofen and meloxicam PRN. These helped, out of meds now. - can occasionally get back pain flare up Needs refill on medications.  Onychomycosis Bilateral toenails great toe with thickening and discoloration tried topical anti fungal no relief  Health Maintenance:  Colon CA Screening: Never had colonoscopy.Currently asymptomatic.Known family history of colon CA, father age >40.Nearly due for screening testage >45 / now awaiting scheduling  Due for Flu Shot, will receive today   UTD COVID19 vaccine, will request copy of card  Depression screen [facility] 2/9 09/27/2020 02/14/2020 01/11/2020  Decreased Interest 0 0 0  Down, Depressed, Hopeless 0 0 0  PHQ - 2 Score 0 0 0    Past Medical History:  Diagnosis Date  . Hypertension    No past surgical history on file. Social History   Socioeconomic History  . Marital status: Significant Other    Spouse  name: Not on file  . Number of children: Not on file  . Years of education: Not on file  . Highest education level: High school graduate  Occupational History  . Occupation: disability   Tobacco Use  . Smoking status: Never Smoker  . Smokeless tobacco: Never Used  Vaping Use  . Vaping Use: Never used  Substance and Sexual Activity  . Alcohol use: Never  . Drug use: Never  . Sexual activity: Not on file  Other Topics Concern  . Not on file  Social History Narrative   Attends church and sees friends or family often    Social Determinants of Health   Financial Resource Strain:   . Difficulty of Paying Living Expenses: Not on file  Food Insecurity:   . Worried About 01/13/2020 in the Last Year: Not on file  . Ran Out of Food in the Last Year: Not on file  Transportation Needs:   . Lack of Transportation (Medical): Not on file  . Lack of Transportation (Non-Medical): Not on file  Physical Activity:   . Days of Exercise per Week: Not on file  . Minutes of Exercise per Session: Not on file  Stress:   . Feeling of Stress : Not on file  Social Connections:   . Frequency of Communication with Friends and Family: Not on file  . Frequency of Social Gatherings with Friends and Family: Not on file  . Attends Religious Services: Not on file  . Active Member of Clubs or Organizations: Not on file  . Attends  Club or Organization Meetings: Not on file  . Marital Status: Not on file  Intimate Partner Violence:   . Fear of Current or Ex-Partner: Not on file  . Emotionally Abused: Not on file  . Physically Abused: Not on file  . Sexually Abused: Not on file   Family History  Problem Relation Age of Onset  . Diabetes Father   . Colon cancer Father   . Hypertension Mother   . Diabetes Sister   . Diabetes Brother   . Diabetes Paternal Uncle   . Diabetes Paternal Grandfather   . Prostate cancer Neg Hx    No current outpatient medications on file prior to visit.   No  current facility-administered medications on file prior to visit.    Review of Systems  Constitutional: Negative for activity change, appetite change, chills, diaphoresis, fatigue and fever.  HENT: Negative for congestion and hearing loss.   Eyes: Negative for visual disturbance.  Respiratory: Negative for cough, chest tightness, shortness of breath and wheezing.   Cardiovascular: Negative for chest pain, palpitations and leg swelling.  Gastrointestinal: Negative for abdominal pain, constipation, diarrhea, nausea and vomiting.  Endocrine: Negative for cold intolerance.  Genitourinary: Negative for dysuria, frequency and hematuria.  Musculoskeletal: Negative for arthralgias and neck pain.  Skin: Negative for rash.       Toenail thickening  Allergic/Immunologic: Negative for environmental allergies.  Neurological: Negative for dizziness, weakness, light-headedness, numbness and headaches.  Hematological: Negative for adenopathy.  Psychiatric/Behavioral: Negative for behavioral problems, dysphoric mood and sleep disturbance.   Per HPI unless specifically indicated above      Objective:    BP 131/79   Pulse 82   Temp (!) 97.3 F (36.3 C) (Temporal)   Resp 16   Ht 5\' 4"  (1.626 m)   Wt 143 lb 9.6 oz (65.1 kg)   SpO2 100%   BMI 24.65 kg/m   Wt Readings from Last 3 Encounters:  09/27/20 143 lb 9.6 oz (65.1 kg)  02/14/20 142 lb (64.4 kg)  10/01/19 145 lb (65.8 kg)    Physical Exam Vitals and nursing note reviewed.  Constitutional:      General: He is not in acute distress.    Appearance: He is well-developed. He is not diaphoretic.     Comments: Well-appearing, comfortable, cooperative  HENT:     Head: Normocephalic and atraumatic.  Eyes:     General:        Right eye: No discharge.        Left eye: No discharge.     Conjunctiva/sclera: Conjunctivae normal.     Pupils: Pupils are equal, round, and reactive to light.  Neck:     Thyroid: No thyromegaly.     Vascular: No  carotid bruit.  Cardiovascular:     Rate and Rhythm: Normal rate and regular rhythm.     Heart sounds: Normal heart sounds. No murmur heard.   Pulmonary:     Effort: Pulmonary effort is normal. No respiratory distress.     Breath sounds: Normal breath sounds. No wheezing or rales.  Abdominal:     General: Bowel sounds are normal. There is no distension.     Palpations: Abdomen is soft. There is no mass.     Tenderness: There is no abdominal tenderness.  Musculoskeletal:        General: No tenderness. Normal range of motion.     Cervical back: Normal range of motion and neck supple.     Comments: Upper / Lower  Extremities: - Normal muscle tone, strength bilateral upper extremities 5/5, lower extremities 5/5  Lymphadenopathy:     Cervical: No cervical adenopathy.  Skin:    General: Skin is warm and dry.     Findings: No erythema or rash.  Neurological:     Mental Status: He is alert and oriented to person, place, and time.     Comments: Distal sensation intact to light touch all extremities  Psychiatric:        Behavior: Behavior normal.     Comments: Well groomed, good eye contact. Very limited speech due to intellectual disability      Recent Labs    02/14/20 1104  HGBA1C 5.7*    Results for orders placed or performed in visit on 02/14/20  POCT HgB A1C  Result Value Ref Range   Hemoglobin A1C 5.7 (A) 4.0 - 5.6 %      Assessment & Plan:   Problem List Items Addressed This Visit    Pre-diabetes   Relevant Orders   Hemoglobin A1c   Intellectual disability   Hypercholesteremia   Relevant Medications   amLODipine (NORVASC) 5 MG tablet   Other Relevant Orders   COMPLETE METABOLIC PANEL WITH GFR   Lipid panel   Essential hypertension   Relevant Medications   amLODipine (NORVASC) 5 MG tablet   Other Relevant Orders   CBC with Differential/Platelet   COMPLETE METABOLIC PANEL WITH GFR    Other Visit Diagnoses    Annual physical exam    -  Primary   Relevant  Orders   Hemoglobin A1c   CBC with Differential/Platelet   COMPLETE METABOLIC PANEL WITH GFR   Lipid panel   PSA   Flu vaccine need       Relevant Orders   Flu Vaccine QUAD 36+ mos IM (Completed)   Onychomycosis of toenail       Relevant Medications   terbinafine (LAMISIL) 250 MG tablet   Chronic bilateral low back pain without sciatica       Relevant Medications   Baclofen 5 MG TABS   meloxicam (MOBIC) 7.5 MG tablet   Screening PSA (prostate specific antigen)       Relevant Orders   PSA      Updated Health Maintenance information - UTD COVID - Flu vaccine done today Due for fasting labs - will return within 1 week. Encouraged improvement to lifestyle with diet and exercise - Goal of weight loss  Refilled meds  #toenail, onychomycosis Trial terbinafine 250mg  daily x 1-3 months pending results.  Orders Placed This Encounter  Procedures  . Flu Vaccine QUAD 36+ mos IM  . Hemoglobin A1c  . CBC with Differential/Platelet  . COMPLETE METABOLIC PANEL WITH GFR  . Lipid panel    Order Specific Question:   Has the patient fasted?    Answer:   Yes  . PSA     Meds ordered this encounter  Medications  . amLODipine (NORVASC) 5 MG tablet    Sig: Take 1 tablet (5 mg total) by mouth daily.    Dispense:  90 tablet    Refill:  3  . terbinafine (LAMISIL) 250 MG tablet    Sig: Take 1 tablet (250 mg total) by mouth daily.    Dispense:  30 tablet    Refill:  2  . Baclofen 5 MG TABS    Sig: Take 5 mg by mouth 2 (two) times daily as needed (back pain).    Dispense:  30 tablet  Refill:  3  . meloxicam (MOBIC) 7.5 MG tablet    Sig: Take 1 tablet (7.5 mg total) by mouth daily as needed for pain.    Dispense:  30 tablet    Refill:  3      Follow up plan: Return in about 1 week (around 10/04/2020) for 1 week fasting lab only then follow-up 6 month PreDM A1c.  Saralyn PilarAlexander Sonny Anthes, DO [facility] 09/27/2020, 2:25 PM

## 2020-09-27 NOTE — Patient Instructions (Addendum)
Thank you for coming to the office today.  Please call GI office for colonoscopy scheduling, if they need a new referral - let us know and we can order it. Or they can contact us.  DUE for FASTING BLOOD WORK (no food or drink after midnight before the lab appointment, only water or coffee without cream/sugar on the morning of)  SCHEDULE "Lab Only" visit in the morning at the clinic for lab draw in 1 WEEK  - Make sure Lab Only appointment is at about 1 week before your next appointment, so that results will be available  For Lab Results, once available within 2-3 days of blood draw, you can can log in to MyChart online to view your results and a brief explanation. Also, we can discuss results at next follow-up visit.   Please schedule a Follow-up Appointment to: Return in about 1 week (around 10/04/2020) for 1 week fasting lab only then follow-up 6 month PreDM A1c.  If you have any other questions or concerns, please feel free to call the office or send a message through MyChart. You may also schedule an earlier appointment if necessary.  Additionally, you may be receiving a survey about your experience at our office within a few days to 1 week by e-mail or mail. We value your feedback.  Saralyn Pilar, DO [facility], [place]

## 2020-10-04 ENCOUNTER — Other Ambulatory Visit: Payer: Medicare Other

## 2020-10-05 LAB — CBC WITH DIFFERENTIAL/PLATELET
Absolute Monocytes: 412 cells/uL (ref 200–950)
Basophils Absolute: 29 cells/uL (ref 0–200)
Basophils Relative: 0.7 %
Eosinophils Absolute: 59 cells/uL (ref 15–500)
Eosinophils Relative: 1.4 %
HCT: 41.3 % (ref 38.5–50.0)
Hemoglobin: 14.6 g/dL (ref 13.2–17.1)
Lymphs Abs: 1382 cells/uL (ref 850–3900)
MCH: 32.7 pg (ref 27.0–33.0)
MCHC: 35.4 g/dL (ref 32.0–36.0)
MCV: 92.6 fL (ref 80.0–100.0)
MPV: 11.5 fL (ref 7.5–12.5)
Monocytes Relative: 9.8 %
Neutro Abs: 2318 cells/uL (ref 1500–7800)
Neutrophils Relative %: 55.2 %
Platelets: 191 10*3/uL (ref 140–400)
RBC: 4.46 10*6/uL (ref 4.20–5.80)
RDW: 13.5 % (ref 11.0–15.0)
Total Lymphocyte: 32.9 %
WBC: 4.2 10*3/uL (ref 3.8–10.8)

## 2020-10-05 LAB — LIPID PANEL
Cholesterol: 281 mg/dL — ABNORMAL HIGH (ref <200)
HDL: 61 mg/dL (ref >=40)
LDL Cholesterol (Calc): 201 mg/dL (calc) — ABNORMAL HIGH
Non-HDL Cholesterol (Calc): 220 mg/dL (calc) — ABNORMAL HIGH (ref <130)
Total CHOL/HDL Ratio: 4.6 (calc) (ref <5.0)
Triglycerides: 79 mg/dL (ref <150)

## 2020-10-05 LAB — COMPLETE METABOLIC PANEL WITH GFR
AG Ratio: 1.6 (calc) (ref 1.0–2.5)
ALT: 22 U/L (ref 9–46)
AST: 22 U/L (ref 10–40)
Albumin: 4.5 g/dL (ref 3.6–5.1)
Alkaline phosphatase (APISO): 70 U/L (ref 36–130)
BUN: 17 mg/dL (ref 7–25)
CO2: 26 mmol/L (ref 20–32)
Calcium: 9.7 mg/dL (ref 8.6–10.3)
Chloride: 104 mmol/L (ref 98–110)
Creat: 1.01 mg/dL (ref 0.60–1.35)
GFR, Est African American: 103 mL/min/{1.73_m2} (ref >=60)
GFR, Est Non African American: 89 mL/min/{1.73_m2} (ref >=60)
Globulin: 2.9 g/dL (calc) (ref 1.9–3.7)
Glucose, Bld: 100 mg/dL — ABNORMAL HIGH (ref 65–99)
Potassium: 4 mmol/L (ref 3.5–5.3)
Sodium: 139 mmol/L (ref 135–146)
Total Bilirubin: 0.6 mg/dL (ref 0.2–1.2)
Total Protein: 7.4 g/dL (ref 6.1–8.1)

## 2020-10-05 LAB — PSA: PSA: 1.23 ng/mL (ref <=4.0)

## 2020-10-05 LAB — HEMOGLOBIN A1C
Hgb A1c MFr Bld: 5.8 % of total Hgb — ABNORMAL HIGH (ref <5.7)
Mean Plasma Glucose: 120 (calc)
eAG (mmol/L): 6.6 (calc)

## 2020-10-06 ENCOUNTER — Other Ambulatory Visit: Payer: Self-pay | Admitting: Family Medicine

## 2020-10-06 DIAGNOSIS — E7800 Pure hypercholesterolemia, unspecified: Secondary | ICD-10-CM

## 2020-10-06 MED ORDER — ROSUVASTATIN CALCIUM 20 MG PO TABS: 20.0000 mg | ORAL_TABLET | Freq: Every day | ORAL | 3 refills | Status: DC

## 2021-01-12 ENCOUNTER — Other Ambulatory Visit: Payer: Self-pay | Admitting: Family Medicine

## 2021-01-12 DIAGNOSIS — B351 Tinea unguium: Secondary | ICD-10-CM

## 2021-01-22 ENCOUNTER — Telehealth: Payer: Self-pay | Admitting: Family Medicine

## 2021-01-22 NOTE — Telephone Encounter (Signed)
Copied from CRM [id number]. Topic: Medicare AWV >> Jan 22, 2021 11:21 AM Claudette Laws R wrote: Reason for CRM:   No answer unable to leave a message for patient to call back and schedule the Medicare Annual Wellness Visit (AWV) virtually or by telephone.  Last AWV 01/11/2020  Please schedule at anytime with [facility].  40 minute appointment  Any questions, please call me at [phone]

## 2021-03-07 ENCOUNTER — Other Ambulatory Visit: Payer: Self-pay

## 2021-03-07 DIAGNOSIS — B351 Tinea unguium: Secondary | ICD-10-CM

## 2021-03-07 MED ORDER — TERBINAFINE HCL 250 MG PO TABS: 250.0000 mg | ORAL_TABLET | Freq: Every day | ORAL | 0 refills | Status: DC

## 2021-03-27 ENCOUNTER — Other Ambulatory Visit: Payer: Self-pay

## 2021-03-27 ENCOUNTER — Other Ambulatory Visit: Payer: Self-pay | Admitting: Family Medicine

## 2021-03-27 ENCOUNTER — Ambulatory Visit
Admission: RE | Admit: 2021-03-27 | Discharge: 2021-03-27 | Disposition: A | Discharge status: Home / Self Care | Payer: Medicare Other | Attending: Family Medicine | Admitting: Family Medicine

## 2021-03-27 ENCOUNTER — Ambulatory Visit
Admission: RE | Admit: 2021-03-27 | Discharge: 2021-03-27 | Disposition: A | Discharge status: Home / Self Care | Payer: Medicare Other | Source: Ambulatory Visit | Attending: Family Medicine | Admitting: Family Medicine

## 2021-03-27 ENCOUNTER — Ambulatory Visit (INDEPENDENT_AMBULATORY_CARE_PROVIDER_SITE_OTHER): Payer: Medicare Other | Admitting: Family Medicine

## 2021-03-27 ENCOUNTER — Encounter: Payer: Self-pay | Admitting: Family Medicine

## 2021-03-27 VITALS — BP 130/82 | HR 73 | Ht 64.0 in | Wt 147.0 lb

## 2021-03-27 DIAGNOSIS — R7303 Prediabetes: Secondary | ICD-10-CM

## 2021-03-27 DIAGNOSIS — F79 Unspecified intellectual disabilities: Secondary | ICD-10-CM

## 2021-03-27 DIAGNOSIS — G8929 Other chronic pain: Secondary | ICD-10-CM

## 2021-03-27 DIAGNOSIS — Z0000 Encounter for general adult medical examination without abnormal findings: Secondary | ICD-10-CM

## 2021-03-27 DIAGNOSIS — M7541 Impingement syndrome of right shoulder: Secondary | ICD-10-CM | POA: Insufficient documentation

## 2021-03-27 DIAGNOSIS — M25511 Pain in right shoulder: Secondary | ICD-10-CM

## 2021-03-27 DIAGNOSIS — I10 Essential (primary) hypertension: Secondary | ICD-10-CM | POA: Diagnosis not present

## 2021-03-27 DIAGNOSIS — Z125 Encounter for screening for malignant neoplasm of prostate: Secondary | ICD-10-CM

## 2021-03-27 DIAGNOSIS — E7800 Pure hypercholesterolemia, unspecified: Secondary | ICD-10-CM

## 2021-03-27 LAB — POCT GLYCOSYLATED HEMOGLOBIN (HGB A1C): Hemoglobin A1C: 5.6 % (ref 4.0–5.6)

## 2021-03-27 MED ORDER — MELOXICAM 7.5 MG PO TABS: 7.5000 mg | ORAL_TABLET | Freq: Every day | ORAL | 5 refills | Status: DC | PRN

## 2021-03-27 MED ORDER — BACLOFEN 5 MG PO TABS: 5.0000 mg | ORAL_TABLET | Freq: Two times a day (BID) | ORAL | 5 refills | Status: DC | PRN

## 2021-03-27 NOTE — Patient Instructions (Addendum)
Thank you for coming to the office today.  Age 45 for Cologuard, we will order this in the future.  BP mild elevated initially. No change of BP medication at this time. Keep track of BP medications.  Recent Labs    10/04/20 0923 03/27/21 1427  HGBA1C 5.8* 5.6   X-ray today, we can review it and call you OR review it in office next week.  Continue Meloxicam  May also try to START anti inflammatory topical - OTC Voltaren (generic Diclofenac) topical 2-4 times a day as needed for pain swelling of affected joint for 1-2 weeks or longer.  Start taking Baclofen (Lioresal) 10mg  (muscle relaxant) - start with half (cut) to one whole pill at night as needed for next 1-3 nights (may make you drowsy, caution with driving) see how it affects you, then if tolerated increase to one pill 2 to 3 times a day or (every 8 hours as needed)  Recommend to start taking Tylenol Extra Strength 500mg  tabs - take 1 to 2 tabs per dose (max 1000mg ) every 6-8 hours for pain (take regularly, don't skip a dose for next 7 days), max 24 hour daily dose is 6 tablets or 3000mg . In the future you can repeat the same everyday Tylenol course for 1-2 weeks at a time.    DUE for FASTING BLOOD WORK (no food or drink after midnight before the lab appointment, only water or coffee without cream/sugar on the morning of)  SCHEDULE "Lab Only" visit in the morning at the clinic for lab draw in 6 MONTHS   - Make sure Lab Only appointment is at about 1 week before your next appointment, so that results will be available  For Lab Results, once available within 2-3 days of blood draw, you can can log in to MyChart online to view your results and a brief explanation. Also, we can discuss results at next follow-up visit.   Please schedule a Follow-up Appointment to: Return in about 1 week (around 04/03/2021) for within 1 week first available for R shoulder steroid injection - also 6 month lab then Physical.  If you have any other  questions or concerns, please feel free to call the office or send a message through MyChart. You may also schedule an earlier appointment if necessary.  Additionally, you may be receiving a survey about your experience at our office within a few days to 1 week by e-mail or mail. We value your feedback.  , DO [facility], [facility]  Range of Motion Shoulder Exercises  Pendulum Circles - Lean with your good arm against a counter or table for support [facility] forward with a wide stance (make sure your body is comfortable) - Your painful shoulder should hang down and feel "heavy" - Gently move your painful arm in small circles "clockwise" for several turns - Switch to "counterclockwise" for several turns - Early on keep circles narrow and move slowly - Later in rehab, move in larger circles and faster movement   Wall Crawl - Stand close (about 1-2 ft away) to a wall, facing it directly - Reach out with your arm of painful shoulder and place fingers (not palm) on wall - You should make contact with wall at your waist level - Slowly walk your fingers up the wall. Stay in contact with wall entire time, do not remove fingers - Keep walking fingers up wall until you reach shoulder level - You may feel tightening or mild discomfort, once you  reach a height that causes pain or if you are already above your shoulder height then stop. Repeat from starting position. - Early on stand closer to wall, move fingers slowly, and stay at or below shoulder level - Later in rehab, stand farther away from wall (fingertips), move fingers quicker, go above shoulder level

## 2021-03-27 NOTE — Assessment & Plan Note (Signed)
Improved, now mostly controlled HTN on amlodipine No known complications     Plan:  1. Continue Amlodipine 10mg  daily 2. Encourage improved lifestyle - low sodium diet, regular exercise 3. Continue monitor BP outside office, bring readings to next visit, if persistently >140/90 or new symptoms notify office sooner 4. Follow-up 6 months

## 2021-03-27 NOTE — Progress Notes (Signed)
Subjective:    Patient ID: Peter Mcmahon, male    DOB: 03/04/1976, 45 y.o.   MRN: [id number]  Peter Mcmahon is a 45 y.o. male presenting on 03/27/2021 for Prediabetes and Shoulder Pain   HPI   Pre-Diabetes Last result5.8 today result A1c improved to 5.6 Not checking CBG Meds:Never on med Lifestyle: - Family history of Diabetes - Diet (eating more at home, inc vegetables) - Exercise (activity, some walking) Denies hypoglycemia, polyuria, visual changes, numbness or tingling.  CHRONIC HTN Reportsprior history of elevated BP in past. Home BP readings are under control, doing well. Today doing better on lower dose med Current Meds -Amlodipine 5mg  daily Fam history mother and grandmother on mom side HTN - sister Denies CP, dyspnea, edema, dizziness / lightheadedness  Right Shoulder Pain Reports 3 months onset with R shoulder, episodic few times during week can wake him up at night. History of heavy lifting in past with heavy boxes years prior. Taking Meloxicam 7.5mg  PRN   Health Maintenance:  Colon CA Screening: Never had colonoscopy.Currently asymptomatic.Known family history of colon CA, father age >25.We attempted to order Cologuard in 2020 was not covered due to insurance, goal to be age 20+ before will cover. They will pursue screening right at age 19    Depression screen [facility] 2/9 03/27/2021 09/27/2020 02/14/2020  Decreased Interest 0 0 0  Down, Depressed, Hopeless 0 0 0  PHQ - 2 Score 0 0 0  Altered sleeping 0 - -  Tired, decreased energy 0 - -  Change in appetite 0 - -  Feeling bad or failure about yourself  0 - -  Trouble concentrating 0 - -  Moving slowly or fidgety/restless 0 - -  Suicidal thoughts 0 - -  PHQ-9 Score 0 - -  Difficult doing work/chores Not difficult at all - -    Social History   Tobacco Use  . Smoking status: Never Smoker  . Smokeless tobacco: Never Used  Vaping Use  . Vaping Use: Never used  Substance Use Topics  . Alcohol  use: Never  . Drug use: Never    Review of Systems Per HPI unless specifically indicated above     Objective:    BP 130/82 (BP Location: Left Arm, Cuff Size: Normal)   Pulse 73   Ht 5\' 4"  (1.626 m)   Wt 147 lb (66.7 kg)   SpO2 100%   BMI 25.23 kg/m   Wt Readings from Last 3 Encounters:  03/27/21 147 lb (66.7 kg)  09/27/20 143 lb 9.6 oz (65.1 kg)  02/14/20 142 lb (64.4 kg)    Physical Exam Vitals and nursing note reviewed.  Constitutional:      General: He is not in acute distress.    Appearance: He is well-developed. He is not diaphoretic.     Comments: Well-appearing, comfortable, cooperative  HENT:     Head: Normocephalic and atraumatic.  Eyes:     General:        Right eye: No discharge.        Left eye: No discharge.     Conjunctiva/sclera: Conjunctivae normal.  Neck:     Thyroid: No thyromegaly.  Cardiovascular:     Rate and Rhythm: Normal rate and regular rhythm.     Heart sounds: Normal heart sounds. No murmur heard.   Pulmonary:     Effort: Pulmonary effort is normal. No respiratory distress.     Breath sounds: Normal breath sounds. No wheezing or rales.  Musculoskeletal:  Cervical back: Normal range of motion and neck supple.     Comments: RIGHT Shoulder Inspection: Normal appearance bilateral symmetrical Palpation: Non-tender to palpation over anterior, lateral, or posterior shoulder  ROM: Reduced ROM forward flex, abduction, int rotation. Special Testing: Rotator cuff testing positive for pain and weakness empty can and Impingement testing positive Strength: Normal strength 5/5 flex/ext, ext rot / int rot, grip, rotator cuff str testing. Neurovascular: Distally intact pulses, sensation to light touch   Lymphadenopathy:     Cervical: No cervical adenopathy.  Skin:    General: Skin is warm and dry.     Findings: No erythema or rash.  Neurological:     Mental Status: He is alert and oriented to person, place, and time.  Psychiatric:         Behavior: Behavior normal.     Comments: Well groomed, good eye contact, normal speech and thoughts        Results for orders placed or performed in visit on 03/27/21  POCT HgB A1C  Result Value Ref Range   Hemoglobin A1C 5.6 4.0 - 5.6 %      Assessment & Plan:   Problem List Items Addressed This Visit    Pre-diabetes - Primary    Mostly well-controlled Pre-DM with A1c 5.6 HTN HLD  Plan:  1. Not on any therapy currently  2. Encourage improved lifestyle - low carb, low sugar diet, reduce portion size, continue improving regular exercise      Relevant Orders   POCT HgB A1C (Completed)   Intellectual disability   Essential hypertension    Improved, now mostly controlled HTN on amlodipine No known complications     Plan:  1. Continue Amlodipine 10mg  daily 2. Encourage improved lifestyle - low sodium diet, regular exercise 3. Continue monitor BP outside office, bring readings to next visit, if persistently >140/90 or new symptoms notify office sooner 4. Follow-up 6 months      Chronic right shoulder pain   Relevant Medications   Baclofen 5 MG TABS   meloxicam (MOBIC) 7.5 MG tablet   Other Relevant Orders   DG Shoulder Right    Other Visit Diagnoses    Rotator cuff impingement syndrome, right       Relevant Medications   Baclofen 5 MG TABS   meloxicam (MOBIC) 7.5 MG tablet   Other Relevant Orders   DG Shoulder Right     Consistent with chronic R-shoulder bursitis vs rotator cuff tendinopathy with some reduced active ROM but without significant evidence of muscle tear (no weakness).  History of heavy lifting in past. No known new injury Age 87, possibly underlying arthritis - No imaging on chart  Plan: 1. R Shoulder x-ray ordered, obtain today review by next week and return for injection subacromial, future refer to Ortho if indicated - Use meloxicam NSAID, topical Voltaren, Tylenol PRN Relative rest but keep shoulder mobile, demonstrated ROM exercises, avoid  heavy lifting May try heating pad PRN     Orders Placed This Encounter  Procedures  . DG Shoulder Right    Standing Status:   Future    Number of Occurrences:   1    Standing Expiration Date:   03/27/2022    Order Specific Question:   Reason for Exam (SYMPTOM  OR DIAGNOSIS REQUIRED)    Answer:   right shoulder pain with reduced range of motion, no trauma or injury    Order Specific Question:   Preferred imaging location?    Answer:  [facility]  . POCT HgB A1C    Meds ordered this encounter  Medications  . Baclofen 5 MG TABS    Sig: Take 5 mg by mouth 2 (two) times daily as needed (back pain).    Dispense:  30 tablet    Refill:  5  . meloxicam (MOBIC) 7.5 MG tablet    Sig: Take 1 tablet (7.5 mg total) by mouth daily as needed for pain.    Dispense:  30 tablet    Refill:  5      Follow up plan: Return in about 1 week (around 04/03/2021) for within 1 week first available for R shoulder steroid injection - also 6 month lab then Physical.  Future labs ordered for 09/25/21   Saralyn Pilar, DO [facility] 03/27/2021, 2:26 PM

## 2021-03-27 NOTE — Progress Notes (Signed)
.  oct

## 2021-03-27 NOTE — Assessment & Plan Note (Signed)
Mostly well-controlled Pre-DM with A1c 5.6 HTN HLD  Plan:  1. Not on any therapy currently  2. Encourage improved lifestyle - low carb, low sugar diet, reduce portion size, continue improving regular exercise

## 2021-04-03 ENCOUNTER — Other Ambulatory Visit: Payer: Self-pay

## 2021-04-03 ENCOUNTER — Ambulatory Visit (INDEPENDENT_AMBULATORY_CARE_PROVIDER_SITE_OTHER): Payer: Medicare Other | Admitting: Family Medicine

## 2021-04-03 VITALS — BP 146/87 | HR 76 | Ht 64.0 in | Wt 147.0 lb

## 2021-04-03 DIAGNOSIS — M7541 Impingement syndrome of right shoulder: Secondary | ICD-10-CM

## 2021-04-03 DIAGNOSIS — G8929 Other chronic pain: Secondary | ICD-10-CM

## 2021-04-03 DIAGNOSIS — M25511 Pain in right shoulder: Secondary | ICD-10-CM

## 2021-04-03 MED ORDER — LIDOCAINE HCL (PF) 1 % IJ SOLN
4.0000 mL | Freq: Once | INTRAMUSCULAR | Status: AC
  Administered 2021-04-03: 4 mL

## 2021-04-03 MED ORDER — METHYLPREDNISOLONE ACETATE 40 MG/ML IJ SUSP: 40.0000 mg | Freq: Once | INTRAMUSCULAR | Status: DC

## 2021-04-03 MED ORDER — TRIAMCINOLONE ACETONIDE 40 MG/ML IJ SUSP
40.0000 mg | Freq: Once | INTRAMUSCULAR | Status: AC
  Administered 2021-04-03: 40 mg via INTRA_ARTICULAR

## 2021-04-03 NOTE — Progress Notes (Signed)
Subjective:    Patient ID: Peter Mcmahon, male    DOB: 11/02/1976, 45 y.o.   MRN: [id number]  Peter Mcmahon is a 45 y.o. male presenting on 04/03/2021 for Shoulder Pain   HPI    Right Shoulder Pain R Shoulder Impingement. Reports >3 months onset with R shoulder, episodic few times during week can wake him up at night. History of heavy lifting in past with heavy boxes years prior. Taking Meloxicam 7.5mg  PRN Last seen 03/27/21 had X-ray done see results below, no arthritis but he has classic impingement symptoms. Today ready for subacromial steroid injection No new injury   Depression screen [facility] 2/9 03/27/2021 09/27/2020 02/14/2020  Decreased Interest 0 0 0  Down, Depressed, Hopeless 0 0 0  PHQ - 2 Score 0 0 0  Altered sleeping 0 - -  Tired, decreased energy 0 - -  Change in appetite 0 - -  Feeling bad or failure about yourself  0 - -  Trouble concentrating 0 - -  Moving slowly or fidgety/restless 0 - -  Suicidal thoughts 0 - -  PHQ-9 Score 0 - -  Difficult doing work/chores Not difficult at all - -    Social History   Tobacco Use  . Smoking status: Never Smoker  . Smokeless tobacco: Never Used  Vaping Use  . Vaping Use: Never used  Substance Use Topics  . Alcohol use: Never  . Drug use: Never    Review of Systems Per HPI unless specifically indicated above     Objective:    BP (!) 146/87   Pulse 76   Ht 5\' 4"  (1.626 m)   Wt 147 lb (66.7 kg)   SpO2 100%   BMI 25.23 kg/m   Wt Readings from Last 3 Encounters:  04/03/21 147 lb (66.7 kg)  03/27/21 147 lb (66.7 kg)  09/27/20 143 lb 9.6 oz (65.1 kg)    Physical Exam Vitals and nursing note reviewed.  Constitutional:      General: He is not in acute distress.    Appearance: He is well-developed. He is not diaphoretic.     Comments: Well-appearing, comfortable, cooperative  HENT:     Head: Normocephalic and atraumatic.  Eyes:     General:        Right eye: No discharge.        Left eye: No discharge.      Conjunctiva/sclera: Conjunctivae normal.  Cardiovascular:     Rate and Rhythm: Normal rate.  Pulmonary:     Effort: Pulmonary effort is normal.  Musculoskeletal:     Comments: RIGHT Shoulder Inspection: Normal appearance bilateral symmetrical Palpation: Non-tender to palpation over anterior, lateral, or posterior shoulder  ROM: Reduced ROM forward flex, abduction, int rotation. Special Testing: Rotator cuff testing positive for pain and weakness empty can and Impingement testing positive Strength: Normal strength 5/5 flex/ext, ext rot / int rot, grip, rotator cuff str testing. Neurovascular: Distally intact pulses, sensation to light touch  Skin:    General: Skin is warm and dry.     Findings: No erythema or rash.  Neurological:     Mental Status: He is alert and oriented to person, place, and time.  Psychiatric:        Behavior: Behavior normal.     Comments: Well groomed, good eye contact, normal speech and thoughts      ________________________________________________________ PROCEDURE NOTE Date: 04/03/21 Right shoulder subacromial injection Discussed benefits and risks (including pain, bleeding, infection, steroid flare). Verbal consent given  by patient. Medication:  1 cc Triamcinolone 40mg  and 4 cc Lidocaine 1% without epi Time Out taken  Landmarks identified. Area cleansed with alcohol wipes. Using 21 gauge and 1, 1/2 inch needle, Right subacromial bursa space was injected (with above listed medication) via posterior approach cold spray used for superficial anesthetic. Sterile bandage placed. Patient tolerated procedure well without bleeding or paresthesias. No complications.  -----------  I have personally reviewed the radiology report from Shoulder R X-ray on 03/27/21.  CLINICAL DATA:  Right shoulder pain with reduced range of motion.  EXAM: RIGHT SHOULDER - 2+ VIEW  COMPARISON:  None.  FINDINGS: There is no evidence of fracture or dislocation. There is  no evidence of arthropathy or other focal bone abnormality. Soft tissues are unremarkable.  IMPRESSION: No significant abnormality of the right shoulder.   Electronically Signed   By: 05/27/21 M.D.   On: 03/29/2021 09:18  Results for orders placed or performed in visit on 03/27/21  POCT HgB A1C  Result Value Ref Range   Hemoglobin A1C 5.6 4.0 - 5.6 %      Assessment & Plan:   Problem List Items Addressed This Visit    Chronic right shoulder pain - Primary    Other Visit Diagnoses    Rotator cuff impingement syndrome, right       Relevant Medications   lidocaine (PF) (XYLOCAINE) 1 % injection 4 mL (Completed)   triamcinolone acetonide (KENALOG-40) injection 40 mg (Completed) (Start on 04/03/2021  2:15 PM)       Consistent with chronic R-shoulder bursitis vs rotator cuff tendinopathy with some reduced active ROM but without significant evidence of muscle tear (no weakness).  History of heavy lifting in past. No known new injury Reviewed R Shoulder X-ray  Plan: Right shoulder subacromial steroid injection performed today, see procedure note for details.  1. Topical NSAID, Tylenol PRN 2. Relative rest but keep shoulder mobile, demonstrated ROM exercises, avoid heavy lifting 3. May try heating pad PRN  Next steps involve refer to Orthopedic in future for further management may need PT  Meds ordered this encounter  Medications  . lidocaine (PF) (XYLOCAINE) 1 % injection 4 mL  . DISCONTD: methylPREDNISolone acetate (DEPO-MEDROL) injection 40 mg  . triamcinolone acetonide (KENALOG-40) injection 40 mg     Follow up plan: Return if symptoms worsen or fail to improve, for sooner within 3-6 weeks if shoulder not improved keep apt for physical in 09/2021.   10/2021, DO [facility] 04/03/2021, 1:55 PM

## 2021-04-03 NOTE — Patient Instructions (Addendum)
Thank you for coming to the office today.   You received a Right Shoulder Joint steroid injection today. - Lidocaine numbing medicine may ease the pain initially for a few hours until it wears off - As discussed, you may experience a "steroid flare" this evening or within 24-48 hours, anytime medicine is injected into an inflamed joint it can cause the pain to get worse temporarily - Everyone responds differently to these injections, it depends on the patient and the severity of the joint problem, it may provide anywhere from days to weeks, to months of relief. Ideal response is >6 months relief - Try to take it easy for next 1-2 days, avoid over activity and strain on joint (limit lifting for shoulder) - Recommend the following:   - For swelling - rest, compression sleeve / ACE wrap, elevation, and ice packs as needed for first few days   - For pain in future may use heating pad or moist heat as needed  - Given the nature of your injury duration of your pain/injury, I am concerned if it does not improve within a few weeks it could be *rotator cuff muscle in your shoulder. If not improving as expected over next several weeks, please follow-up sooner for re-evaluation.  We can refer to orthopedics  Please schedule a Follow-up Appointment to: Return if symptoms worsen or fail to improve, for sooner within 3-6 weeks if shoulder not improved keep apt for physical in 09/2021.  If you have any other questions or concerns, please feel free to call the office or send a message through MyChart. You may also schedule an earlier appointment if necessary.  Additionally, you may be receiving a survey about your experience at our office within a few days to 1 week by e-mail or mail. We value your feedback.  Saralyn Pilar, DO [facility], [facility]  Range of Motion Shoulder Exercises  Pendulum Circles - Lean with your good arm against a counter or table for support [facility] forward with  a wide stance (make sure your body is comfortable) - Your painful shoulder should hang down and feel "heavy" - Gently move your painful arm in small circles "clockwise" for several turns - Switch to "counterclockwise" for several turns - Early on keep circles narrow and move slowly - Later in rehab, move in larger circles and faster movement   Wall Crawl - Stand close (about 1-2 ft away) to a wall, facing it directly - Reach out with your arm of painful shoulder and place fingers (not palm) on wall - You should make contact with wall at your waist level - Slowly walk your fingers up the wall. Stay in contact with wall entire time, do not remove fingers - Keep walking fingers up wall until you reach shoulder level - You may feel tightening or mild discomfort, once you reach a height that causes pain or if you are already above your shoulder height then stop. Repeat from starting position. - Early on stand closer to wall, move fingers slowly, and stay at or below shoulder level - Later in rehab, stand farther away from wall (fingertips), move fingers quicker, go above shoulder level

## 2021-04-20 ENCOUNTER — Other Ambulatory Visit: Payer: Self-pay

## 2021-04-20 ENCOUNTER — Other Ambulatory Visit: Payer: Self-pay | Admitting: Family Medicine

## 2021-04-20 DIAGNOSIS — B351 Tinea unguium: Secondary | ICD-10-CM

## 2021-04-20 MED ORDER — TERBINAFINE HCL 250 MG PO TABS: 250.0000 mg | ORAL_TABLET | Freq: Every day | ORAL | 0 refills | Status: DC

## 2021-04-20 NOTE — Telephone Encounter (Signed)
Requested medication (s) are due for refill today: yes  Requested medication (s) are on the active medication list: yes  Last refill:  03/07/21 #30 0 refills   Future visit scheduled: yes  Notes to clinic:  medication not assigned to a protocol. Do you want to refill Rx?     Requested Prescriptions  Pending Prescriptions Disp Refills   terbinafine (LAMISIL) 250 MG tablet [Pharmacy Med Name: Terbinafine HCl 250 MG Oral Tablet] 30 tablet 0    Sig: Take 1 tablet by mouth once daily      Off-Protocol Failed - 04/20/2021  1:49 PM      Failed - Medication not assigned to a protocol, review manually.      Passed - Valid encounter within last 12 months    Recent Outpatient Visits           2 weeks ago Chronic right shoulder pain   [facility], Netta Neat, DO   3 weeks ago Pre-diabetes   [facility], Netta Neat, DO   6 months ago Annual physical exam   [facility] Smitty Cords, DO   1 year ago Pre-diabetes   [facility] Althea Charon, Netta Neat, DO   1 year ago Annual physical exam   [facility] Smitty Cords, DO       Future Appointments             In 5 months Althea Charon, Netta Neat, DO [facility], [facility]

## 2021-07-24 ENCOUNTER — Encounter: Payer: Self-pay | Admitting: Family Medicine

## 2021-07-24 ENCOUNTER — Other Ambulatory Visit: Payer: Self-pay

## 2021-07-24 ENCOUNTER — Ambulatory Visit (INDEPENDENT_AMBULATORY_CARE_PROVIDER_SITE_OTHER): Payer: Medicare Other | Admitting: Family Medicine

## 2021-07-24 VITALS — BP 136/80 | HR 75 | Ht 64.0 in | Wt 142.6 lb

## 2021-07-24 DIAGNOSIS — M25511 Pain in right shoulder: Secondary | ICD-10-CM

## 2021-07-24 DIAGNOSIS — M7541 Impingement syndrome of right shoulder: Secondary | ICD-10-CM

## 2021-07-24 DIAGNOSIS — G8929 Other chronic pain: Secondary | ICD-10-CM | POA: Diagnosis not present

## 2021-07-24 MED ORDER — MELOXICAM 15 MG PO TABS: 15.0000 mg | ORAL_TABLET | Freq: Every day | ORAL | 2 refills | Status: DC | PRN

## 2021-07-24 MED ORDER — BACLOFEN 10 MG PO TABS: 10.0000 mg | ORAL_TABLET | Freq: Two times a day (BID) | ORAL | 2 refills | Status: DC | PRN

## 2021-07-24 NOTE — Patient Instructions (Addendum)
Thank you for coming to the office today.  [facility] & [facility] MEDICINE [facility] [address] [place], [place]  [address] Phone: [phone]  Contact Orthopedics if not heard back within 1 week.  Keep apt in November  Dose increase Baclofen and Meloxicam.   Please schedule a Follow-up Appointment to: Return if symptoms worsen or fail to improve.  If you have any other questions or concerns, please feel free to call the office or send a message through MyChart. You may also schedule an earlier appointment if necessary.  Additionally, you may be receiving a survey about your experience at our office within a few days to 1 week by e-mail or mail. We value your feedback.  Saralyn Pilar, DO [facility], [place]

## 2021-07-24 NOTE — Progress Notes (Signed)
Subjective:    Patient ID: Peter Mcmahon, male    DOB: 09-30-1976, 45 y.o.   MRN: [id number]  Peter Mcmahon is a 45 y.o. male presenting on 07/24/2021 for Shoulder Pain   HPI  Right Shoulder Pain R Shoulder Impingement. Reports >6 months onset with R shoulder pain. Prior heavier lifting in past repetitive strain on it. No acute traumatic injury. Treatment in past with NSAID meloxicam and Baclofen. He had subacromial injection 03/2021, about 2 months of relief, also had Cathlean Sauer done see below. No new injury Today asks for further treatment options. Denies numbness or tingling   Depression screen [facility] 2/9 07/24/2021 03/27/2021 09/27/2020  Decreased Interest 0 0 0  Down, Depressed, Hopeless 0 0 0  PHQ - 2 Score 0 0 0  Altered sleeping 0 0 -  Tired, decreased energy 0 0 -  Change in appetite 0 0 -  Feeling bad or failure about yourself  0 0 -  Trouble concentrating 0 0 -  Moving slowly or fidgety/restless 0 0 -  Suicidal thoughts 0 0 -  PHQ-9 Score 0 0 -  Difficult doing work/chores Not difficult at all Not difficult at all -    Social History   Tobacco Use   Smoking status: Never   Smokeless tobacco: Never  Vaping Use   Vaping Use: Never used  Substance Use Topics   Alcohol use: Never   Drug use: Never    Review of Systems Per HPI unless specifically indicated above     Objective:    BP 136/80   Pulse 75   Ht 5\' 4"  (1.626 m)   Wt 142 lb 9.6 oz (64.7 kg)   SpO2 100%   BMI 24.48 kg/m   Wt Readings from Last 3 Encounters:  07/24/21 142 lb 9.6 oz (64.7 kg)  04/03/21 147 lb (66.7 kg)  03/27/21 147 lb (66.7 kg)    Physical Exam Vitals and nursing note reviewed.  Constitutional:      General: He is not in acute distress.    Appearance: Normal appearance. He is well-developed. He is not diaphoretic.     Comments: Well-appearing, comfortable, cooperative  HENT:     Head: Normocephalic and atraumatic.  Eyes:     General:        Right eye: No discharge.         Left eye: No discharge.     Conjunctiva/sclera: Conjunctivae normal.  Cardiovascular:     Rate and Rhythm: Normal rate.  Pulmonary:     Effort: Pulmonary effort is normal.  Musculoskeletal:     Comments: R Shoulder mostly intact ROM has some pain with full flexion abduction. Positive impingement testing.  Skin:    General: Skin is warm and dry.     Findings: No erythema or rash.  Neurological:     Mental Status: He is alert and oriented to person, place, and time.  Psychiatric:        Mood and Affect: Mood normal.        Behavior: Behavior normal.        Thought Content: Thought content normal.     Comments: Well groomed, good eye contact, normal speech and thoughts    I have personally reviewed the radiology report from 03/29/21.  CLINICAL DATA:  Right shoulder pain with reduced range of motion.   EXAM: RIGHT SHOULDER - 2+ VIEW   COMPARISON:  None.   FINDINGS: There is no evidence of fracture or dislocation. There is no evidence  of arthropathy or other focal bone abnormality. Soft tissues are unremarkable.   IMPRESSION: No significant abnormality of the right shoulder.     Electronically Signed   By: Acquanetta Belling M.D.   On: 03/29/2021 09:18  Results for orders placed or performed in visit on 03/27/21  POCT HgB A1C  Result Value Ref Range   Hemoglobin A1C 5.6 4.0 - 5.6 %      Assessment & Plan:   Problem List Items Addressed This Visit     Chronic right shoulder pain - Primary   Relevant Medications   baclofen (LIORESAL) 10 MG tablet   meloxicam (MOBIC) 15 MG tablet   Other Relevant Orders   Ambulatory referral to Orthopedic Surgery   Other Visit Diagnoses     Rotator cuff impingement syndrome, right       Relevant Medications   baclofen (LIORESAL) 10 MG tablet   meloxicam (MOBIC) 15 MG tablet   Other Relevant Orders   Ambulatory referral to Orthopedic Surgery       >6 months now Limited relief from subacromial injection 03/2021 lasted 2 months  with improvement now returned  Consistent with chronic R-shoulder bursitis vs rotator cuff tendinopathy with some reduced active ROM but without significant evidence of muscle tear (no weakness).  History of heavy lifting in past. No known new injury Reviewed R Shoulder X-ray 03/2021   Plan: Increase dose Meloxicam from 7.5 up to 15mg  and baclofen 5 to 10mg  new orders Referral to [facility]    Meds ordered this encounter  Medications   baclofen (LIORESAL) 10 MG tablet    Sig: Take 1 tablet (10 mg total) by mouth 2 (two) times daily as needed for muscle spasms.    Dispense:  60 each    Refill:  2   meloxicam (MOBIC) 15 MG tablet    Sig: Take 1 tablet (15 mg total) by mouth daily as needed for pain.    Dispense:  30 tablet    Refill:  2    Orders Placed This Encounter  Procedures   Ambulatory referral to Orthopedic Surgery    Referral Priority:   Routine    Referral Type:   Surgical    Referral Reason:   Specialty Services Required    Requested Specialty:   Orthopedic Surgery    Number of Visits Requested:   1      Follow up plan: Return if symptoms worsen or fail to improve.    , DO [facility] 07/24/2021, 8:58 AM

## 2021-08-28 ENCOUNTER — Other Ambulatory Visit: Payer: Self-pay | Admitting: Family Medicine

## 2021-08-28 DIAGNOSIS — E7800 Pure hypercholesterolemia, unspecified: Secondary | ICD-10-CM

## 2021-08-28 NOTE — Telephone Encounter (Signed)
Requested Prescriptions  Pending Prescriptions Disp Refills  . rosuvastatin (CRESTOR) 20 MG tablet [Pharmacy Med Name: Rosuvastatin Calcium 20 MG Oral Tablet] 90 tablet 0    Sig: TAKE 1 TABLET BY MOUTH AT BEDTIME     Cardiovascular:  Antilipid - Statins Failed - 08/28/2021 12:08 PM      Failed - Total Cholesterol in normal range and within 360 days    Cholesterol  Date Value Ref Range Status  10/04/2020 281 (H) <200 mg/dL Final         Failed - LDL in normal range and within 360 days    LDL Cholesterol (Calc)  Date Value Ref Range Status  10/04/2020 201 (H) mg/dL (calc) Final    Comment:    LDL-C levels > or = 190 mg/dL may indicate familial  hypercholesterolemia (FH). Clinical assessment and  measurement of blood lipid levels should be  considered for all first degree relatives of  patients with an FH diagnosis.  For questions about testing for familial hypercholesterolemia, please call Engineer, materials at [email or web address]. Wardell Honour, et al. J National Lipid Association  Recommendations for Patient-Centered Management of  Dyslipidemia: Part 1 Journal of Clinical Lipidology  2015;9(2), 129-169. Reference range: <100 . Desirable range <100 mg/dL for primary prevention;   <70 mg/dL for patients with CHD or diabetic patients  with > or = 2 CHD risk factors. Marland Kitchen LDL-C is now calculated using the Martin-Hopkins  calculation, which is a validated novel method providing  better accuracy than the Friedewald equation in the  estimation of LDL-C.  Horald Pollen et al. Lenox Ahr. 2376;283(15): 2061-2068  (http://education.[employer].com/faq/FAQ164)          Passed - HDL in normal range and within 360 days    HDL  Date Value Ref Range Status  10/04/2020 61 > OR = 40 mg/dL Final         Passed - Triglycerides in normal range and within 360 days    Triglycerides  Date Value Ref Range Status  10/04/2020 79 <150 mg/dL Final         Passed - Patient is not pregnant       Passed - Valid encounter within last 12 months    Recent Outpatient Visits          1 month ago Chronic right shoulder pain   [facility] Smitty Cords, DO   4 months ago Chronic right shoulder pain   [facility] Smitty Cords, DO   5 months ago Pre-diabetes   [facility] Smitty Cords, DO   11 months ago Annual physical exam   [facility] Smitty Cords, DO   1 year ago Pre-diabetes   [facility] Althea Charon, Netta Neat, DO      Future Appointments            In 1 month Althea Charon, Netta Neat, DO [facility], [facility]

## 2021-09-25 ENCOUNTER — Other Ambulatory Visit: Payer: Medicare Other

## 2021-09-25 ENCOUNTER — Ambulatory Visit (INDEPENDENT_AMBULATORY_CARE_PROVIDER_SITE_OTHER): Payer: Medicare Other | Admitting: Family Medicine

## 2021-09-25 DIAGNOSIS — Z0000 Encounter for general adult medical examination without abnormal findings: Secondary | ICD-10-CM | POA: Diagnosis not present

## 2021-09-25 NOTE — Progress Notes (Signed)
Subjective:   Peter Mcmahon is a 45 y.o. male who presents for Medicare Annual/Subsequent preventive examination.  I connected with  Peter Mcmahon on 09/25/21 by a telephone enabled telemedicine application and verified that I am speaking with the correct person using two identifiers.   I discussed the limitations of evaluation and management by telemedicine. The patient expressed understanding and agreed to proceed.    Review of Systems     Cardiac Risk Factors include: advanced age (>25men, >1 women);male gender     Objective:    Today's Vitals   There is no height or weight on file to calculate BMI.  Advanced Directives 09/25/2021 01/11/2020 04/14/2019 01/05/2019  Does Patient Have a Medical Advance Directive? No No No No  Would patient like information on creating a medical advance directive? No - Patient declined - No - Patient declined Yes (MAU/Ambulatory/Procedural Areas - Information given)    Current Medications (verified) Outpatient Encounter Medications as of 09/25/2021  Medication Sig   amLODipine (NORVASC) 5 MG tablet Take 1 tablet (5 mg total) by mouth daily.   baclofen (LIORESAL) 10 MG tablet Take 1 tablet (10 mg total) by mouth 2 (two) times daily as needed for muscle spasms.   meloxicam (MOBIC) 15 MG tablet Take 1 tablet (15 mg total) by mouth daily as needed for pain.   rosuvastatin (CRESTOR) 20 MG tablet TAKE 1 TABLET BY MOUTH AT BEDTIME   No facility-administered encounter medications on file as of 09/25/2021.    Allergies (verified) Patient has no known allergies.   History: Past Medical History:  Diagnosis Date   Hypertension    History reviewed. No pertinent surgical history. Family History  Problem Relation Age of Onset   Diabetes Father    Colon cancer Father    Hypertension Mother    Diabetes Sister    Diabetes Brother    Diabetes Paternal Uncle    Diabetes Paternal Grandfather    Prostate cancer Neg Hx    Social History    Socioeconomic History   Marital status: Significant Other    Spouse name: Not on file   Number of children: Not on file   Years of education: Not on file   Highest education level: High school graduate  Occupational History   Occupation: disability   Tobacco Use   Smoking status: Never   Smokeless tobacco: Never  Vaping Use   Vaping Use: Never used  Substance and Sexual Activity   Alcohol use: Never   Drug use: Never   Sexual activity: Not Currently  Other Topics Concern   Not on file  Social History Narrative   Attends church and sees friends or family often    Social Determinants of Health   Financial Resource Strain: Low Risk    Difficulty of Paying Living Expenses: Not hard at all  Food Insecurity: No Food Insecurity   Worried About Programme researcher, broadcasting/film/video in the Last Year: Never true   Barista in the Last Year: Never true  Transportation Needs: No Transportation Needs   Lack of Transportation (Medical): No   Lack of Transportation (Non-Medical): No  Physical Activity: Insufficiently Active   Days of Exercise per Week: 3 days   Minutes of Exercise per Session: 20 min  Stress: No Stress Concern Present   Feeling of Stress : Not at all  Social Connections: Moderately Isolated   Frequency of Communication with Friends and Family: Twice a week   Frequency of Social Gatherings with  Friends and Family: More than three times a week   Attends Religious Services: More than 4 times per year   Active Member of Clubs or Organizations: No   Attends Engineer, structural: Never   Marital Status: Never married    Tobacco Counseling Counseling given: Not Answered   Clinical Intake:  Pre-visit preparation completed: Yes  Pain : No/denies pain     Nutritional Risks: None Diabetes: No  How often do you need to have someone help you when you read instructions, pamphlets, or other written materials from your doctor or pharmacy?: 1 - Never  Diabetic?   no  Interpreter Needed?: No  Information entered by :: Remi Haggard LPN   Activities of Daily Living In your present state of health, do you have any difficulty performing the following activities: 09/25/2021  Hearing? N  Vision? N  Difficulty concentrating or making decisions? Y  Walking or climbing stairs? N  Dressing or bathing? N  Doing errands, shopping? N  Preparing Food and eating ? N  Using the Toilet? N  In the past six months, have you accidently leaked urine? N  Do you have problems with loss of bowel control? N  Managing your Medications? N  Managing your Finances? Y  Housekeeping or managing your Housekeeping? N  Some recent data might be hidden    Patient Care Team: Smitty Cords, DO as PCP - General (Family Medicine)  Indicate any recent Medical Services you may have received from other than [facility] providers in the past year (date may be approximate).     Assessment:   This is a routine wellness examination for Peter Mcmahon.  Hearing/Vision screen Hearing Screening - Comments:: No trouble hearing Vision Screening - Comments:: Unsure of name Not up to  date  Dietary issues and exercise activities discussed: Current Exercise Habits: Home exercise routine, Type of exercise: strength training/weights, Time (Minutes): 20, Frequency (Times/Week): 3, Weekly Exercise (Minutes/Week): 60, Intensity: Mild   Goals Addressed             This Visit's Progress    Patient Stated       No goals at this time        Depression Screen PHQ 2/9 Scores 09/25/2021 07/24/2021 03/27/2021 09/27/2020 02/14/2020 01/11/2020 08/13/2019  PHQ - 2 Score 0 0 0 0 0 0 0  PHQ- 9 Score - 0 0 - - - -  Exception Documentation - - - - - - -    Fall Risk Fall Risk  09/25/2021 07/24/2021 03/27/2021 09/27/2020 02/14/2020  Falls in the past year? 0 0 0 0 0  Number falls in past yr: 0 0 0 0 0  Injury with Fall? 0 0 0 0 0  Follow up Falls evaluation completed;Falls prevention discussed Falls  evaluation completed Falls evaluation completed Falls evaluation completed Falls evaluation completed    FALL RISK PREVENTION PERTAINING TO THE HOME:  Any stairs in or around the home? Yes  If so, are there any without handrails? No  Home free of loose throw rugs in walkways, pet beds, electrical cords, etc? Yes  Adequate lighting in your home to reduce risk of falls? Yes   ASSISTIVE DEVICES UTILIZED TO PREVENT FALLS:  Life alert? No  Use of a cane, walker or w/c? No  Grab bars in the bathroom? No  Shower chair or bench in shower? No  Elevated toilet seat or a handicapped toilet? No   TIMED UP AND GO:  Was the test performed? No .  Cognitive Function:     6CIT Screen 09/25/2021 01/05/2019  What Year? 0 points 0 points  What month? 0 points 0 points  What time? 3 points 0 points  Count back from 20 2 points 0 points  Months in reverse 2 points 0 points  Repeat phrase 4 points 2 points  Total Score 11 2    Immunizations Immunization History  Administered Date(s) Administered   Influenza,inj,Quad PF,6+ Mos 08/31/2018, 08/13/2019, 09/27/2020   PFIZER(Purple Top)SARS-COV-2 Vaccination 06/28/2020, 07/26/2020   Tdap 06/30/2018    TDAP status: Up to date  Flu Vaccine status: Due, Education has been provided regarding the importance of this vaccine. Advised may receive this vaccine at local pharmacy or Health Dept. Aware to provide a copy of the vaccination record if obtained from local pharmacy or Health Dept. Verbalized acceptance and understanding.    Covid-19 vaccine status: Information provided on how to obtain vaccines.     Screening Tests Health Maintenance  Topic Date Due   Hepatitis C Screening  Never done   COLONOSCOPY (Pts 45-64yrs Insurance coverage will need to be confirmed)  Never done   COVID-19 Vaccine (3 - Booster for [employer] series) 09/20/2020   INFLUENZA VACCINE  06/18/2021   TETANUS/TDAP  06/30/2028   HIV Screening  Completed   Pneumococcal  Vaccine 7-22 Years old  Aged Out   HPV VACCINES  Aged Out    Health Maintenance  Health Maintenance Due  Topic Date Due   Hepatitis C Screening  Never done   COLONOSCOPY (Pts 45-80yrs Insurance coverage will need to be confirmed)  Never done   COVID-19 Vaccine (3 - Booster for [employer] series) 09/20/2020   INFLUENZA VACCINE  06/18/2021      Lung Cancer Screening: (Low Dose CT Chest recommended if Age 43-80 years, 30 pack-year currently smoking OR have quit w/in 15years.) does not qualify.   Lung Cancer Screening Referral:   Additional Screening:  Hepatitis C Screening: does not qualify;   Vision Screening: Recommended annual ophthalmology exams for early detection of glaucoma and other disorders of the eye. Is the patient up to date with their annual eye exam?  No  Who is the provider or what is the name of the office in which the patient attends annual eye exams? Unsure of name If pt is not established with a provider, would they like to be referred to a provider to establish care? No .   Dental Screening: Recommended annual dental exams for proper oral hygiene  Community Resource Referral / Chronic Care Management: CRR required this visit?  No   CCM required this visit?  No      Plan:     I have personally reviewed and noted the following in the patient's chart:   Medical and social history Use of alcohol, tobacco or illicit drugs  Current medications and supplements including opioid prescriptions. Patient is not currently taking opioid prescriptions. Functional ability and status Nutritional status Physical activity Advanced directives List of other physicians Hospitalizations, surgeries, and ER visits in previous 12 months Vitals Screenings to include cognitive, depression, and falls Referrals and appointments  In addition, I have reviewed and discussed with patient certain preventive protocols, quality metrics, and best practice recommendations. A written  personalized care plan for preventive services as well as general preventive health recommendations were provided to patient.     Remi Haggard, LPN   [id number]   Nurse Notes:

## 2021-09-25 NOTE — Patient Instructions (Addendum)
[name] , Thank you for taking time to come for your Medicare Wellness Visit. I appreciate your ongoing commitment to your health goals. Please review the following plan we discussed and let me know if I can assist you in the future.   Screening recommendations/referrals: Colonoscopy: Education provided Recommended yearly ophthalmology/optometry visit for glaucoma screening and checkup Recommended yearly dental visit for hygiene and checkup  Vaccinations: Influenza vaccine: education provided Pneumococcal vaccine: not indicated Tdap vaccine: up to date Shingles vaccine: not indicated    Advanced directives: Education provided  Conditions/risks identified:   Next appointment: 11-15-@ 1:40 Peter Mcmahon  Preventive Care 40-64 Years, Male Preventive care refers to lifestyle choices and visits with your health care provider that can promote health and wellness. What does preventive care include? A yearly physical exam. This is also called an annual well check. Dental exams once or twice a year. Routine eye exams. Ask your health care provider how often you should have your eyes checked. Personal lifestyle choices, including: Daily care of your teeth and gums. Regular physical activity. Eating a healthy diet. Avoiding tobacco and drug use. Limiting alcohol use. Practicing safe sex. Taking low-dose aspirin every day starting at age 79. What happens during an annual well check? The services and screenings done by your health care provider during your annual well check will depend on your age, overall health, lifestyle risk factors, and family history of disease. Counseling  Your health care provider may ask you questions about your: Alcohol use. Tobacco use. Drug use. Emotional well-being. Home and relationship well-being. Sexual activity. Eating habits. Work and work Astronomer. Screening  You may have the following tests or measurements: Height, weight, and BMI. Blood  pressure. Lipid and cholesterol levels. These may be checked every 5 years, or more frequently if you are over 13 years old. Skin check. Lung cancer screening. You may have this screening every year starting at age 23 if you have a 30-pack-year history of smoking and currently smoke or have quit within the past 15 years. Fecal occult blood test (FOBT) of the stool. You may have this test every year starting at age 70. Flexible sigmoidoscopy or colonoscopy. You may have a sigmoidoscopy every 5 years or a colonoscopy every 10 years starting at age 48. Prostate cancer screening. Recommendations will vary depending on your family history and other risks. Hepatitis C blood test. Hepatitis B blood test. Sexually transmitted disease (STD) testing. Diabetes screening. This is done by checking your blood sugar (glucose) after you have not eaten for a while (fasting). You may have this done every 1-3 years. Discuss your test results, treatment options, and if necessary, the need for more tests with your health care provider. Vaccines  Your health care provider may recommend certain vaccines, such as: Influenza vaccine. This is recommended every year. Tetanus, diphtheria, and acellular pertussis (Tdap, Td) vaccine. You may need a Td booster every 10 years. Zoster vaccine. You may need this after age 43. Pneumococcal 13-valent conjugate (PCV13) vaccine. You may need this if you have certain conditions and have not been vaccinated. Pneumococcal polysaccharide (PPSV23) vaccine. You may need one or two doses if you smoke cigarettes or if you have certain conditions. Talk to your health care provider about which screenings and vaccines you need and how often you need them. This information is not intended to replace advice given to you by your health care provider. Make sure you discuss any questions you have with your health care provider. Document Released: 12/01/2015 Document  Revised: 07/24/2016 Document  Reviewed: 09/05/2015 Elsevier Interactive Patient Education  2017 [employer].  Fall Prevention in the Home Falls can cause injuries. They can happen to people of all ages. There are many things you can do to make your home safe and to help prevent falls. What can I do on the outside of my home? Regularly fix the edges of walkways and driveways and fix any cracks. Remove anything that might make you trip as you walk through a door, such as a raised step or threshold. Trim any bushes or trees on the path to your home. Use bright outdoor lighting. Clear any walking paths of anything that might make someone trip, such as rocks or tools. Regularly check to see if handrails are loose or broken. Make sure that both sides of any steps have handrails. Any raised decks and porches should have guardrails on the edges. Have any leaves, snow, or ice cleared regularly. Use sand or salt on walking paths during winter. Clean up any spills in your garage right away. This includes oil or grease spills. What can I do in the bathroom? Use night lights. Install grab bars by the toilet and in the tub and shower. Do not use towel bars as grab bars. Use non-skid mats or decals in the tub or shower. If you need to sit down in the shower, use a plastic, non-slip stool. Keep the floor dry. Clean up any water that spills on the floor as soon as it happens. Remove soap buildup in the tub or shower regularly. Attach bath mats securely with double-sided non-slip rug tape. Do not have throw rugs and other things on the floor that can make you trip. What can I do in the bedroom? Use night lights. Make sure that you have a light by your bed that is easy to reach. Do not use any sheets or blankets that are too big for your bed. They should not hang down onto the floor. Have a firm chair that has side arms. You can use this for support while you get dressed. Do not have throw rugs and other things on the floor that can  make you trip. What can I do in the kitchen? Clean up any spills right away. Avoid walking on wet floors. Keep items that you use a lot in easy-to-reach places. If you need to reach something above you, use a strong step stool that has a grab bar. Keep electrical cords out of the way. Do not use floor polish or wax that makes floors slippery. If you must use wax, use non-skid floor wax. Do not have throw rugs and other things on the floor that can make you trip. What can I do with my stairs? Do not leave any items on the stairs. Make sure that there are handrails on both sides of the stairs and use them. Fix handrails that are broken or loose. Make sure that handrails are as long as the stairways. Check any carpeting to make sure that it is firmly attached to the stairs. Fix any carpet that is loose or worn. Avoid having throw rugs at the top or bottom of the stairs. If you do have throw rugs, attach them to the floor with carpet tape. Make sure that you have a light switch at the top of the stairs and the bottom of the stairs. If you do not have them, ask someone to add them for you. What else can I do to help prevent falls? Wear  shoes that: Do not have high heels. Have rubber bottoms. Are comfortable and fit you well. Are closed at the toe. Do not wear sandals. If you use a stepladder: Make sure that it is fully opened. Do not climb a closed stepladder. Make sure that both sides of the stepladder are locked into place. Ask someone to hold it for you, if possible. Clearly mark and make sure that you can see: Any grab bars or handrails. First and last steps. Where the edge of each step is. Use tools that help you move around (mobility aids) if they are needed. These include: Canes. Walkers. Scooters. Crutches. Turn on the lights when you go into a dark area. Replace any light bulbs as soon as they burn out. Set up your furniture so you have a clear path. Avoid moving your furniture  around. If any of your floors are uneven, fix them. If there are any pets around you, be aware of where they are. Review your medicines with your doctor. Some medicines can make you feel dizzy. This can increase your chance of falling. Ask your doctor what other things that you can do to help prevent falls. This information is not intended to replace advice given to you by your health care provider. Make sure you discuss any questions you have with your health care provider. Document Released: 08/31/2009 Document Revised: 04/11/2016 Document Reviewed: 12/09/2014 Elsevier Interactive Patient Education  2017 [employer].

## 2021-09-28 ENCOUNTER — Other Ambulatory Visit: Payer: Self-pay

## 2021-09-28 ENCOUNTER — Other Ambulatory Visit: Payer: Medicare Other

## 2021-09-28 DIAGNOSIS — F79 Unspecified intellectual disabilities: Secondary | ICD-10-CM

## 2021-09-28 DIAGNOSIS — Z0000 Encounter for general adult medical examination without abnormal findings: Secondary | ICD-10-CM

## 2021-09-28 DIAGNOSIS — I10 Essential (primary) hypertension: Secondary | ICD-10-CM

## 2021-09-28 DIAGNOSIS — Z125 Encounter for screening for malignant neoplasm of prostate: Secondary | ICD-10-CM

## 2021-09-28 DIAGNOSIS — E7800 Pure hypercholesterolemia, unspecified: Secondary | ICD-10-CM

## 2021-09-28 DIAGNOSIS — R7303 Prediabetes: Secondary | ICD-10-CM

## 2021-09-29 LAB — CBC WITH DIFFERENTIAL/PLATELET
Absolute Monocytes: 508 cells/uL (ref 200–950)
Basophils Absolute: 9 cells/uL (ref 0–200)
Basophils Relative: 0.2 %
Eosinophils Absolute: 38 cells/uL (ref 15–500)
Eosinophils Relative: 0.8 %
HCT: 42.1 % (ref 38.5–50.0)
Hemoglobin: 14.5 g/dL (ref 13.2–17.1)
Lymphs Abs: 1640 cells/uL (ref 850–3900)
MCH: 32.2 pg (ref 27.0–33.0)
MCHC: 34.4 g/dL (ref 32.0–36.0)
MCV: 93.3 fL (ref 80.0–100.0)
MPV: 11.9 fL (ref 7.5–12.5)
Monocytes Relative: 10.8 %
Neutro Abs: 2505 cells/uL (ref 1500–7800)
Neutrophils Relative %: 53.3 %
Platelets: 169 10*3/uL (ref 140–400)
RBC: 4.51 10*6/uL (ref 4.20–5.80)
RDW: 13.2 % (ref 11.0–15.0)
Total Lymphocyte: 34.9 %
WBC: 4.7 10*3/uL (ref 3.8–10.8)

## 2021-09-29 LAB — LIPID PANEL
Cholesterol: 157 mg/dL (ref <200)
HDL: 63 mg/dL (ref >=40)
LDL Cholesterol (Calc): 80 mg/dL (calc)
Non-HDL Cholesterol (Calc): 94 mg/dL (calc) (ref <130)
Total CHOL/HDL Ratio: 2.5 (calc) (ref <5.0)
Triglycerides: 64 mg/dL (ref <150)

## 2021-09-29 LAB — COMPLETE METABOLIC PANEL WITH GFR
AG Ratio: 1.7 (calc) (ref 1.0–2.5)
ALT: 27 U/L (ref 9–46)
AST: 26 U/L (ref 10–40)
Albumin: 4.6 g/dL (ref 3.6–5.1)
Alkaline phosphatase (APISO): 67 U/L (ref 36–130)
BUN: 16 mg/dL (ref 7–25)
CO2: 29 mmol/L (ref 20–32)
Calcium: 9.9 mg/dL (ref 8.6–10.3)
Chloride: 104 mmol/L (ref 98–110)
Creat: 1.05 mg/dL (ref 0.60–1.29)
Globulin: 2.7 g/dL (calc) (ref 1.9–3.7)
Glucose, Bld: 90 mg/dL (ref 65–139)
Potassium: 4.3 mmol/L (ref 3.5–5.3)
Sodium: 143 mmol/L (ref 135–146)
Total Bilirubin: 1 mg/dL (ref 0.2–1.2)
Total Protein: 7.3 g/dL (ref 6.1–8.1)
eGFR: 88 mL/min/{1.73_m2} (ref >=60)

## 2021-09-29 LAB — HEMOGLOBIN A1C
Hgb A1c MFr Bld: 5.8 % of total Hgb — ABNORMAL HIGH (ref <5.7)
Mean Plasma Glucose: 120 mg/dL
eAG (mmol/L): 6.6 mmol/L

## 2021-09-29 LAB — PSA: PSA: 1.22 ng/mL (ref <=4.00)

## 2021-10-01 NOTE — Progress Notes (Addendum)
I connected with  Peter Mcmahon on 09-25-2021 by a telephone enabled telemedicine application and verified that I am speaking with the correct person using two identifiers.   I discussed the limitations of evaluation and management by telemedicine. The patient expressed understanding and agreed to proceed.  Patient location: home  Provider location:  Tele-Health  not in office

## 2021-10-02 ENCOUNTER — Other Ambulatory Visit: Payer: Self-pay | Admitting: Family Medicine

## 2021-10-02 ENCOUNTER — Encounter: Payer: Self-pay | Admitting: Family Medicine

## 2021-10-02 ENCOUNTER — Other Ambulatory Visit: Payer: Self-pay

## 2021-10-02 ENCOUNTER — Ambulatory Visit (INDEPENDENT_AMBULATORY_CARE_PROVIDER_SITE_OTHER): Payer: Medicare Other | Admitting: Family Medicine

## 2021-10-02 VITALS — BP 139/81 | HR 76 | Ht 64.0 in | Wt 148.2 lb

## 2021-10-02 DIAGNOSIS — I10 Essential (primary) hypertension: Secondary | ICD-10-CM

## 2021-10-02 DIAGNOSIS — Z23 Encounter for immunization: Secondary | ICD-10-CM | POA: Diagnosis not present

## 2021-10-02 DIAGNOSIS — G8929 Other chronic pain: Secondary | ICD-10-CM

## 2021-10-02 DIAGNOSIS — E7800 Pure hypercholesterolemia, unspecified: Secondary | ICD-10-CM

## 2021-10-02 DIAGNOSIS — Z0000 Encounter for general adult medical examination without abnormal findings: Secondary | ICD-10-CM | POA: Diagnosis not present

## 2021-10-02 DIAGNOSIS — H9192 Unspecified hearing loss, left ear: Secondary | ICD-10-CM

## 2021-10-02 DIAGNOSIS — M7541 Impingement syndrome of right shoulder: Secondary | ICD-10-CM

## 2021-10-02 DIAGNOSIS — M25511 Pain in right shoulder: Secondary | ICD-10-CM

## 2021-10-02 DIAGNOSIS — Z125 Encounter for screening for malignant neoplasm of prostate: Secondary | ICD-10-CM

## 2021-10-02 DIAGNOSIS — R7303 Prediabetes: Secondary | ICD-10-CM

## 2021-10-02 DIAGNOSIS — F79 Unspecified intellectual disabilities: Secondary | ICD-10-CM

## 2021-10-02 MED ORDER — ROSUVASTATIN CALCIUM 20 MG PO TABS: 20.0000 mg | ORAL_TABLET | Freq: Every day | ORAL | 3 refills | Status: DC

## 2021-10-02 MED ORDER — AMLODIPINE BESYLATE 5 MG PO TABS: 5.0000 mg | ORAL_TABLET | Freq: Every day | ORAL | 3 refills | Status: DC

## 2021-10-02 MED ORDER — BACLOFEN 10 MG PO TABS
10.0000 mg | ORAL_TABLET | Freq: Three times a day (TID) | ORAL | 3 refills | Status: DC | PRN
Start: 2021-10-02 — End: 2022-12-12

## 2021-10-02 NOTE — Patient Instructions (Addendum)
Thank you for coming to the office today.  If still not improving after 1 more month (3 months after injection) you can contact them to follow-up  Dorthula Nettles, DO   [address]   [place], [place] [address]   [phone] (Work)    -----------  [employer] Shot today.  New COVID19 Booster just came out in September 2022, eligible as long as it has been >2 months. Since last one.  Amazing cholesterol results  REfilled meds  keep on current.  Colonoscopy age 32+  ------------------  [facility] for hearing aid [facility] [address]  [place], [place] [address] Ph: [phone]   DUE for FASTING BLOOD WORK (no food or drink after midnight before the lab appointment, only water or coffee without cream/sugar on the morning of)  SCHEDULE "Lab Only" visit in the morning at the clinic for lab draw in  1 YEAR  - Make sure Lab Only appointment is at about 1 week before your next appointment, so that results will be available  For Lab Results, once available within 2-3 days of blood draw, you can can log in to MyChart online to view your results and a brief explanation. Also, we can discuss results at next follow-up visit.    Please schedule a Follow-up Appointment to: Return in about 1 year (around 10/02/2022) for 1 year fasting lab only then 1 week later Annual Physical.  If you have any other questions or concerns, please feel free to call the office or send a message through MyChart. You may also schedule an earlier appointment if necessary.  Additionally, you may be receiving a survey about your experience at our office within a few days to 1 week by e-mail or mail. We value your feedback.  Saralyn Pilar, DO [facility], [place]

## 2021-10-02 NOTE — Assessment & Plan Note (Signed)
Dramatic improvement now on statin therapy LDL down from >200 down to 80  Plan: 1. Continue Rosuvastatin 20mg  daily 2. Encourage improved lifestyle - low carb/cholesterol, reduce portion size, continue improving regular exercise Follow-up yearly

## 2021-10-02 NOTE — Assessment & Plan Note (Signed)
Mostly well-controlled Pre-DM with A1c 5.8 HTN HLD  Plan:  1. Not on any therapy currently  2. Encourage improved lifestyle - low carb, low sugar diet, reduce portion size, continue improving regular exercise

## 2021-10-02 NOTE — Assessment & Plan Note (Signed)
Improved, now mostly controlled HTN on amlodipine No known complications     Plan:  1. Continue Amlodipine 10mg  daily 2. Encourage improved lifestyle - low sodium diet, regular exercise 3. Continue monitor BP outside office, bring readings to next visit, if persistently >140/90 or new symptoms notify office sooner

## 2021-10-02 NOTE — Progress Notes (Signed)
Subjective:    Patient ID: Peter Mcmahon, male    DOB: 04/18/1976, 45 y.o.   MRN: [id number]  Peter Mcmahon is a 45 y.o. male presenting on 10/02/2021 for Annual Exam   HPI  Here for Annual Physical and Lab Review. With caregiver, [name]   Pre-Diabetes Last A1c 5.8, stable range. Not checking CBG Meds: Never on med Lifestyle: - Family history of Diabetes - Diet (Still trying to improve low carb diet now less sweets, caregiver helps reduce his sweets, - Exercise (activity, some walking) Denies hypoglycemia, polyuria, visual changes, numbness or tingling.   CHRONIC HTN Reports prior history of elevated BP in past. Home BP readings are under control, doing well. Today doing better on lower dose med Current Meds - Amlodipine 56m daily Fam history mother and grandmother on mom side HTN - sister Admits headaches Denies CP, dyspnea, edema, dizziness / lightheadedness  HYPERLIPIDEMIA: - Reports no concerns. Last lipid panel 09/2021, controlled on statin now in past 1 year, LDL 200 down to 80, and total cholesterol major improve - Currently taking Rosuvastatin 222m tolerating well without side effects or myalgias   History of Back Pain Flare up Last treatment back in May 2020 at ED given baclofen and meloxicam PRN. These helped, out of meds now. - can occasionally get back pain flare up Needs refill on medications.  Chronic R Shoulder Pain [facility] has seen Dr KuCandelaria Stagershad [place] 07/2021 some benefit but not resolved. Asking about dose adjust on Baclofen 1-2 times per day now On meloxicam PRN  Hearing Loss, chronic L ear Intellectual disability Asking about hearing aid evaluation     Health Maintenance:   Colon CA Screening: Never had colonoscopy. Currently asymptomatic. Known family history of colon CA, father age >6>69 Medicare will not cover until age >5>66+ Due for Flu Shot, will receive today    UTD COVID19 vaccine, will request copy  of card  PSA screening 1.22 negative 09/2021   [facility] 09/25/2021 07/24/2021 03/27/2021  Decreased Interest 0 0 0  Down, Depressed, Hopeless 0 0 0  PHQ - 2 Score 0 0 0  Altered sleeping - 0 0  Tired, decreased energy - 0 0  Change in appetite - 0 0  Feeling bad or failure about yourself  - 0 0  Trouble concentrating - 0 0  Moving slowly or fidgety/restless - 0 0  Suicidal thoughts - 0 0  PHQ-9 Score - 0 0  Difficult doing work/chores - Not difficult at all Not difficult at all    Past Medical History:  Diagnosis Date   Hypertension    History reviewed. No pertinent surgical history. Social History   Socioeconomic History   Marital status: Significant Other    Spouse name: Not on file   Number of children: Not on file   Years of education: Not on file   Highest education level: High school graduate  Occupational History   Occupation: disability   Tobacco Use   Smoking status: Never   Smokeless tobacco: Never  Vaping Use   Vaping Use: Never used  Substance and Sexual Activity   Alcohol use: Never   Drug use: Never   Sexual activity: Not Currently  Other Topics Concern   Not on file  Social History Narrative   Attends church and sees friends or family often    Social Determinants of Health   Financial Resource Strain: Low Risk    Difficulty  of Paying Living Expenses: Not hard at all  Food Insecurity: No Food Insecurity   Worried About [place] in the Last Year: Never true   [place] in the Last Year: Never true  Transportation Needs: No Transportation Needs   Lack of Transportation (Medical): No   Lack of Transportation (Non-Medical): No  Physical Activity: Insufficiently Active   Days of Exercise per Week: 3 days   Minutes of Exercise per Session: 20 min  Stress: No Stress Concern Present   Feeling of Stress : Not at all  Social Connections: Moderately Isolated   Frequency of Communication with Friends and Family: Twice a  week   Frequency of Social Gatherings with Friends and Family: More than three times a week   Attends Religious Services: More than 4 times per year   Active Member of [employer] or Organizations: No   Attends Music therapist: Never   Marital Status: Never married  Human resources officer Violence: Not At Risk   Fear of Current or Ex-Partner: No   Emotionally Abused: No   Physically Abused: No   Sexually Abused: No   Family History  Problem Relation Age of Onset   Diabetes Father    Colon cancer Father    Hypertension Mother    Diabetes Sister    Diabetes Brother    Diabetes Paternal Uncle    Diabetes Paternal Grandfather    Prostate cancer Neg Hx    Current Outpatient Medications on File Prior to Visit  Medication Sig   meloxicam (MOBIC) 15 MG tablet Take 1 tablet (15 mg total) by mouth daily as needed for pain.   No current facility-administered medications on file prior to visit.    Review of Systems  Constitutional:  Negative for activity change, appetite change, chills, diaphoresis, fatigue and fever.  HENT:  Negative for congestion and hearing loss.   Eyes:  Negative for visual disturbance.  Respiratory:  Negative for cough, chest tightness, shortness of breath and wheezing.   Cardiovascular:  Negative for chest pain, palpitations and leg swelling.  Gastrointestinal:  Negative for abdominal pain, constipation, diarrhea, nausea and vomiting.  Genitourinary:  Negative for dysuria, frequency and hematuria.  Musculoskeletal:  Negative for arthralgias and neck pain.  Skin:  Negative for rash.  Neurological:  Negative for dizziness, weakness, light-headedness, numbness and headaches.  Hematological:  Negative for adenopathy.  Psychiatric/Behavioral:  Negative for behavioral problems, dysphoric mood and sleep disturbance.   Per HPI unless specifically indicated above      Objective:    BP 139/81   Pulse 76   Ht '5\' 4"'  (1.626 m)   Wt 148 lb 3.2 oz (67.2 kg)   SpO2  100%   BMI 25.44 kg/m   Wt Readings from Last 3 Encounters:  10/02/21 148 lb 3.2 oz (67.2 kg)  07/24/21 142 lb 9.6 oz (64.7 kg)  04/03/21 147 lb (66.7 kg)    Physical Exam Vitals and nursing note reviewed.  Constitutional:      General: He is not in acute distress.    Appearance: He is well-developed. He is not diaphoretic.     Comments: Well-appearing, comfortable, cooperative  HENT:     Head: Normocephalic and atraumatic.  Eyes:     General:        Right eye: No discharge.        Left eye: No discharge.     Conjunctiva/sclera: Conjunctivae normal.     Pupils: Pupils are equal, round, and  reactive to light.  Neck:     Thyroid: No thyromegaly.  Cardiovascular:     Rate and Rhythm: Normal rate and regular rhythm.     Pulses: Normal pulses.     Heart sounds: Normal heart sounds. No murmur heard. Pulmonary:     Effort: Pulmonary effort is normal. No respiratory distress.     Breath sounds: Normal breath sounds. No wheezing or rales.  Abdominal:     General: Bowel sounds are normal. There is no distension.     Palpations: Abdomen is soft. There is no mass.     Tenderness: There is no abdominal tenderness.  Musculoskeletal:        General: No tenderness. Normal range of motion.     Cervical back: Normal range of motion and neck supple.     Right lower leg: No edema.     Left lower leg: No edema.     Comments: Upper / Lower Extremities: - Normal muscle tone, strength bilateral upper extremities 5/5, lower extremities 5/5  Lymphadenopathy:     Cervical: No cervical adenopathy.  Skin:    General: Skin is warm and dry.     Findings: No erythema or rash.  Neurological:     Mental Status: He is alert and oriented to person, place, and time.     Comments: Distal sensation intact to light touch all extremities  Psychiatric:        Mood and Affect: Mood normal.        Behavior: Behavior normal.        Thought Content: Thought content normal.     Comments: Well groomed, good  eye contact, normal speech and thoughts   Results for orders placed or performed in visit on 09/28/21  PSA  Result Value Ref Range   PSA 1.22 < OR = 4.00 ng/mL  Lipid panel  Result Value Ref Range   Cholesterol 157 <200 mg/dL   HDL 63 > OR = 40 mg/dL   Triglycerides 64 <150 mg/dL   LDL Cholesterol (Calc) 80 mg/dL (calc)   Total CHOL/HDL Ratio 2.5 <5.0 (calc)   Non-HDL Cholesterol (Calc) 94 <130 mg/dL (calc)  COMPLETE METABOLIC PANEL WITH GFR  Result Value Ref Range   Glucose, Bld 90 65 - 139 mg/dL   BUN 16 7 - 25 mg/dL   Creat 1.05 0.60 - 1.29 mg/dL   eGFR 88 > OR = 60 mL/min/1.79m   BUN/Creatinine Ratio NOT APPLICABLE 6 - 22 (calc)   Sodium 143 135 - 146 mmol/L   Potassium 4.3 3.5 - 5.3 mmol/L   Chloride 104 98 - 110 mmol/L   CO2 29 20 - 32 mmol/L   Calcium 9.9 8.6 - 10.3 mg/dL   Total Protein 7.3 6.1 - 8.1 g/dL   Albumin 4.6 3.6 - 5.1 g/dL   Globulin 2.7 1.9 - 3.7 g/dL (calc)   AG Ratio 1.7 1.0 - 2.5 (calc)   Total Bilirubin 1.0 0.2 - 1.2 mg/dL   Alkaline phosphatase (APISO) 67 36 - 130 U/L   AST 26 10 - 40 U/L   ALT 27 9 - 46 U/L  CBC with Differential/Platelet  Result Value Ref Range   WBC 4.7 3.8 - 10.8 Thousand/uL   RBC 4.51 4.20 - 5.80 Million/uL   Hemoglobin 14.5 13.2 - 17.1 g/dL   HCT 42.1 38.5 - 50.0 %   MCV 93.3 80.0 - 100.0 fL   MCH 32.2 27.0 - 33.0 pg   MCHC 34.4 32.0 - 36.0 g/dL  RDW 13.2 11.0 - 15.0 %   Platelets 169 140 - 400 Thousand/uL   MPV 11.9 7.5 - 12.5 fL   Neutro Abs 2,505 1,500 - 7,800 cells/uL   Lymphs Abs 1,640 850 - 3,900 cells/uL   Absolute Monocytes 508 200 - 950 cells/uL   Eosinophils Absolute 38 15 - 500 cells/uL   Basophils Absolute 9 0 - 200 cells/uL   Neutrophils Relative % 53.3 %   Total Lymphocyte 34.9 %   Monocytes Relative 10.8 %   Eosinophils Relative 0.8 %   Basophils Relative 0.2 %  Hemoglobin A1c  Result Value Ref Range   Hgb A1c MFr Bld 5.8 (H) <5.7 % of total Hgb   Mean Plasma Glucose 120 mg/dL   eAG (mmol/L)  6.6 mmol/L      Assessment & Plan:   Problem List Items Addressed This Visit     Pre-diabetes    Mostly well-controlled Pre-DM with A1c 5.8 HTN HLD  Plan:  1. Not on any therapy currently  2. Encourage improved lifestyle - low carb, low sugar diet, reduce portion size, continue improving regular exercise      Intellectual disability   Hypercholesteremia    Dramatic improvement now on statin therapy LDL down from >200 down to 80  Plan: 1. Continue Rosuvastatin 43m daily 2. Encourage improved lifestyle - low carb/cholesterol, reduce portion size, continue improving regular exercise Follow-up yearly      Relevant Medications   rosuvastatin (CRESTOR) 20 MG tablet   amLODipine (NORVASC) 5 MG tablet   Essential hypertension    Improved, now mostly controlled HTN on amlodipine No known complications     Plan:  1. Continue Amlodipine 173mdaily 2. Encourage improved lifestyle - low sodium diet, regular exercise 3. Continue monitor BP outside office, bring readings to next visit, if persistently >140/90 or new symptoms notify office sooner      Relevant Medications   rosuvastatin (CRESTOR) 20 MG tablet   amLODipine (NORVASC) 5 MG tablet   Chronic right shoulder pain   Relevant Medications   baclofen (LIORESAL) 10 MG tablet   Other Visit Diagnoses     Annual physical exam    -  Primary   Needs flu shot       Relevant Orders   Flu Vaccine QUAD 96m47mo (Fluarix, Fluzone & Alfiuria Quad PF) (Completed)   Rotator cuff impingement syndrome, right       Relevant Medications   baclofen (LIORESAL) 10 MG tablet   Hearing loss of left ear, unspecified hearing loss type       Relevant Orders   Ambulatory referral to Audiology       [facility] Will agree to adjust baclofen rx take 78m73mD PRN pain Continue NSAID PRN Can return to Ortho for other options of therapy injection repeat or other   Referral to Audiology Hearing aid L ear hearing,  chronic hearing loss  Orders Placed This Encounter  Procedures   Flu Vaccine QUAD 928mo+54moFluarix, Fluzone & Alfiuria Quad PF)   Ambulatory referral to Audiology    Referral Priority:   Routine    Referral Type:   Audiology Exam    Referral Reason:   Specialty Services Required    Number of Visits Requested:   1     Meds ordered this encounter  Medications   baclofen (LIORESAL) 10 MG tablet    Sig: Take 1 tablet (10 mg total) by mouth 3 (three) times daily as needed  for muscle spasms.    Dispense:  270 each    Refill:  3    Increase frequency and change to 90 day   rosuvastatin (CRESTOR) 20 MG tablet    Sig: Take 1 tablet (20 mg total) by mouth at bedtime.    Dispense:  90 tablet    Refill:  3   amLODipine (NORVASC) 5 MG tablet    Sig: Take 1 tablet (5 mg total) by mouth daily.    Dispense:  90 tablet    Refill:  3      Follow up plan: Return in about 1 year (around 10/02/2022) for 1 year fasting lab only then 1 week later Annual Physical.  Future Labs ordered.   Nobie Putnam, DO [facility] 10/02/2021, 2:06 PM

## 2021-10-14 IMAGING — DX DG SHOULDER 2+V*R*
4 series · 4 of 4 positions shown · non-contrast
Comparison: None.

CLINICAL DATA: Right shoulder pain with reduced range of motion.

EXAM:
RIGHT SHOULDER - 2+ VIEW

[shoulder ap (1 of 2)]
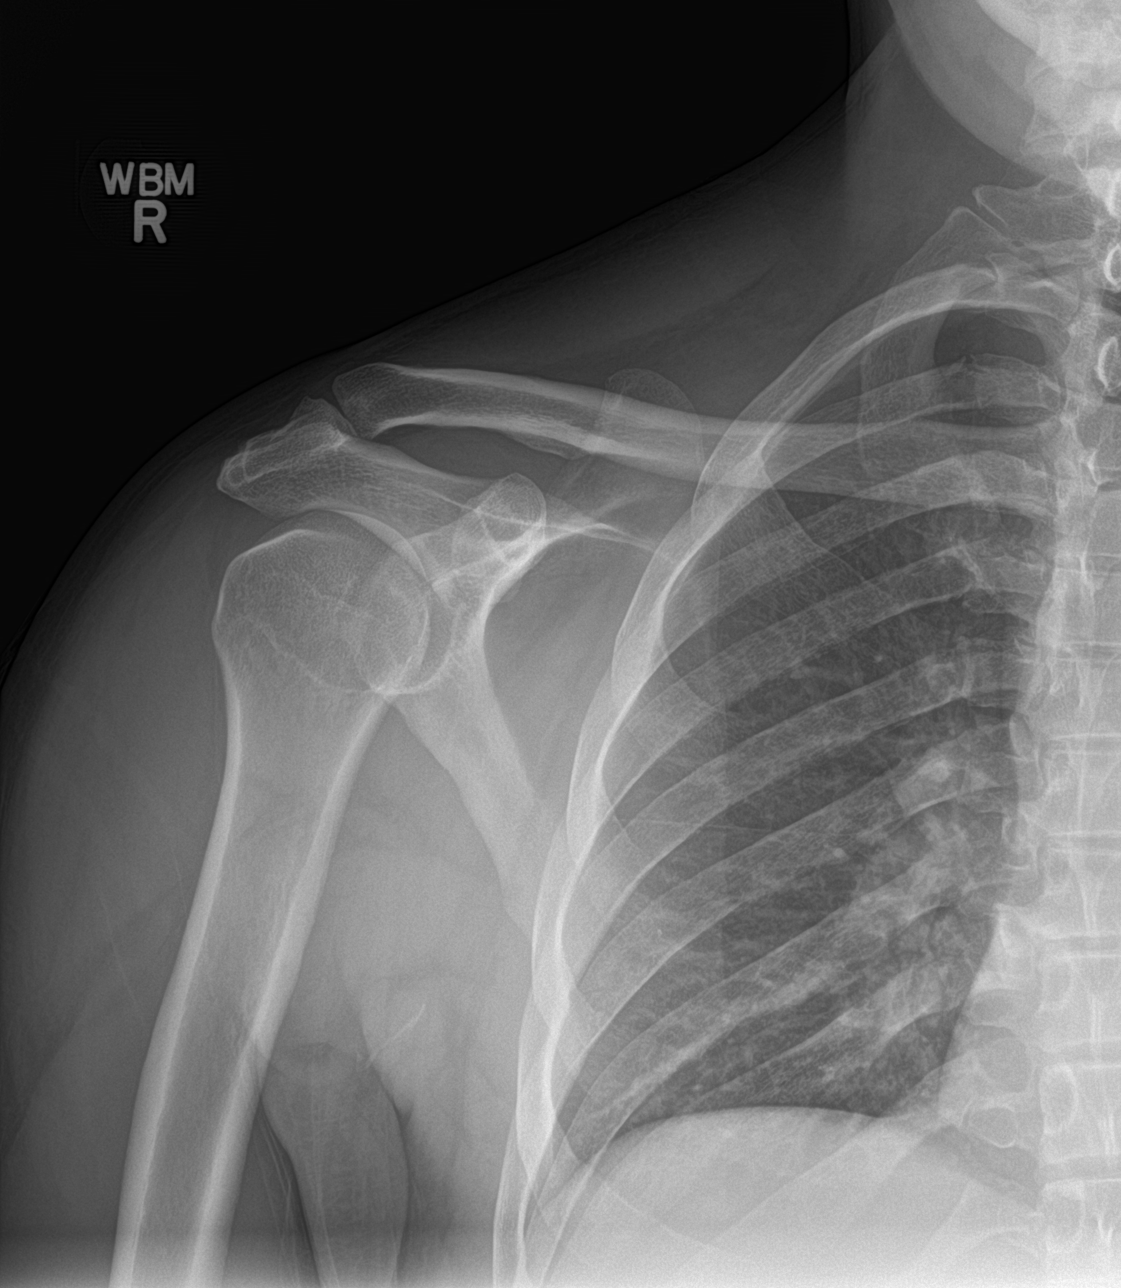

[shoulder y-view]
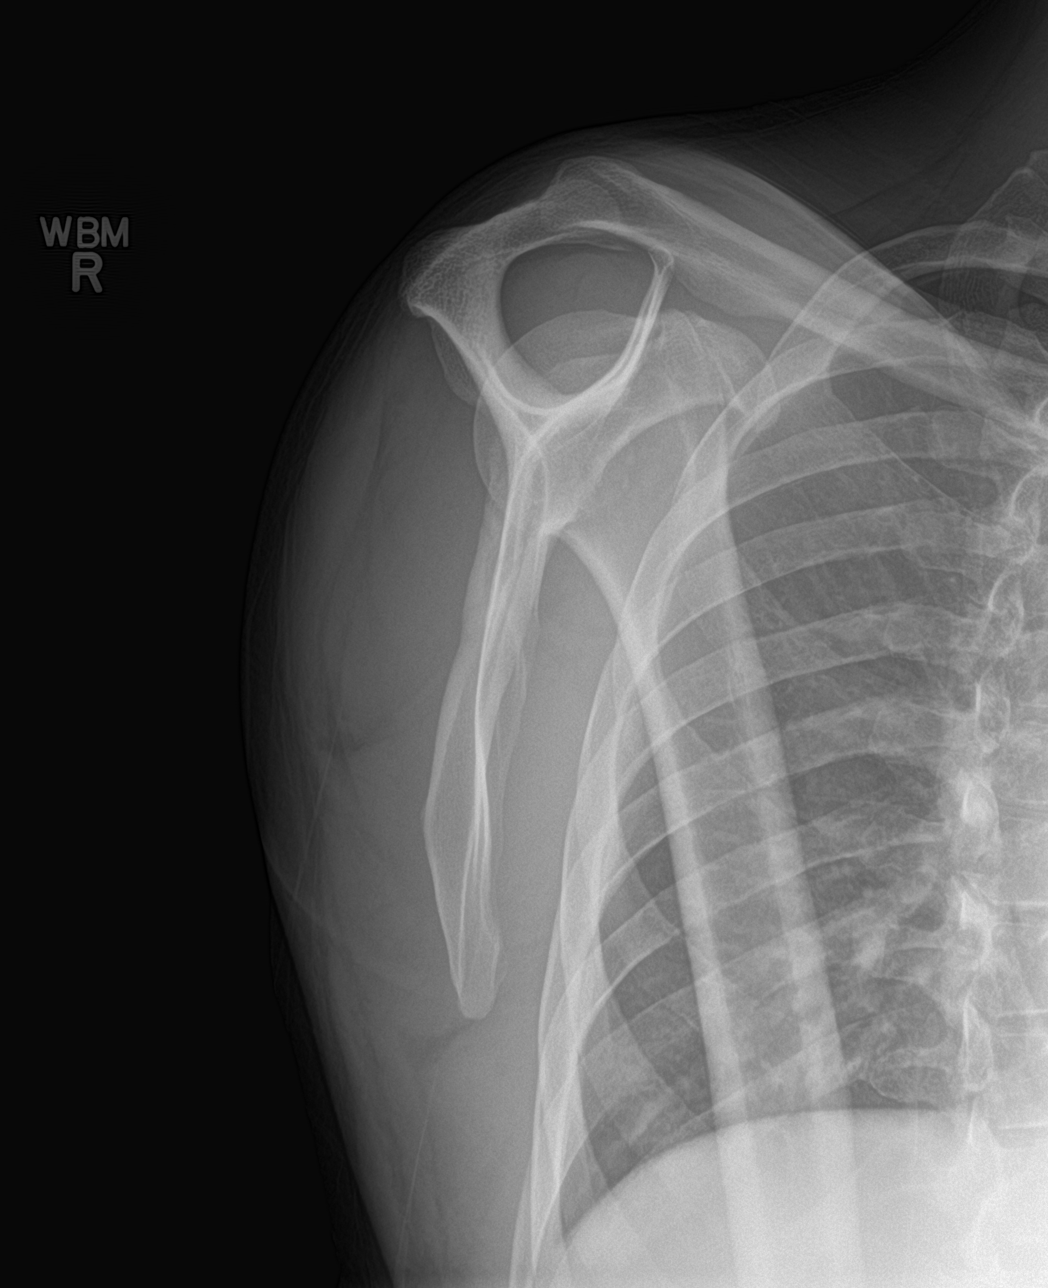

[shoulder axial]
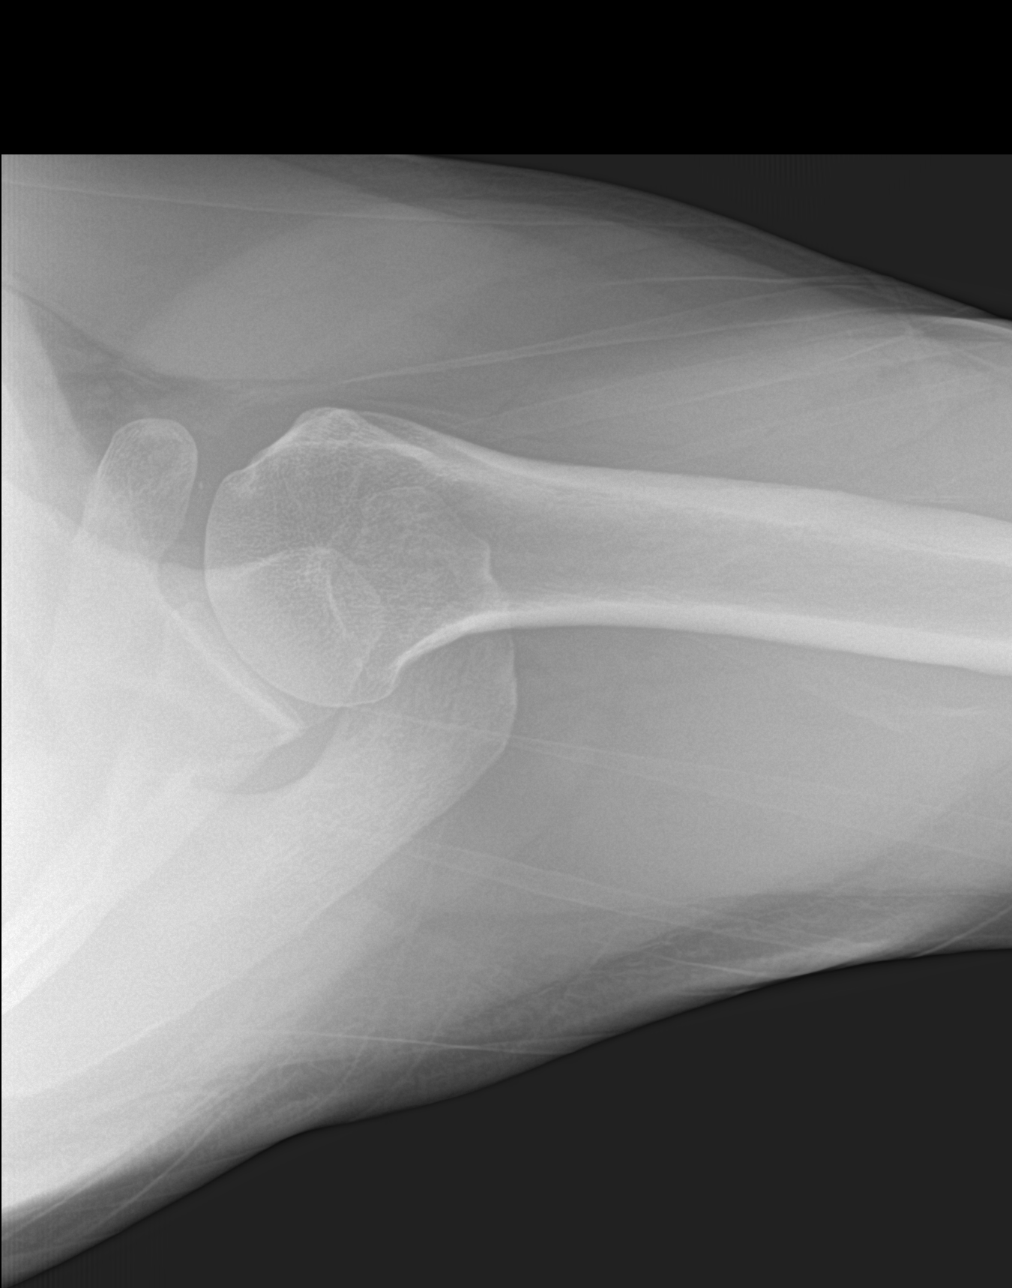

[shoulder ap (2 of 2)]
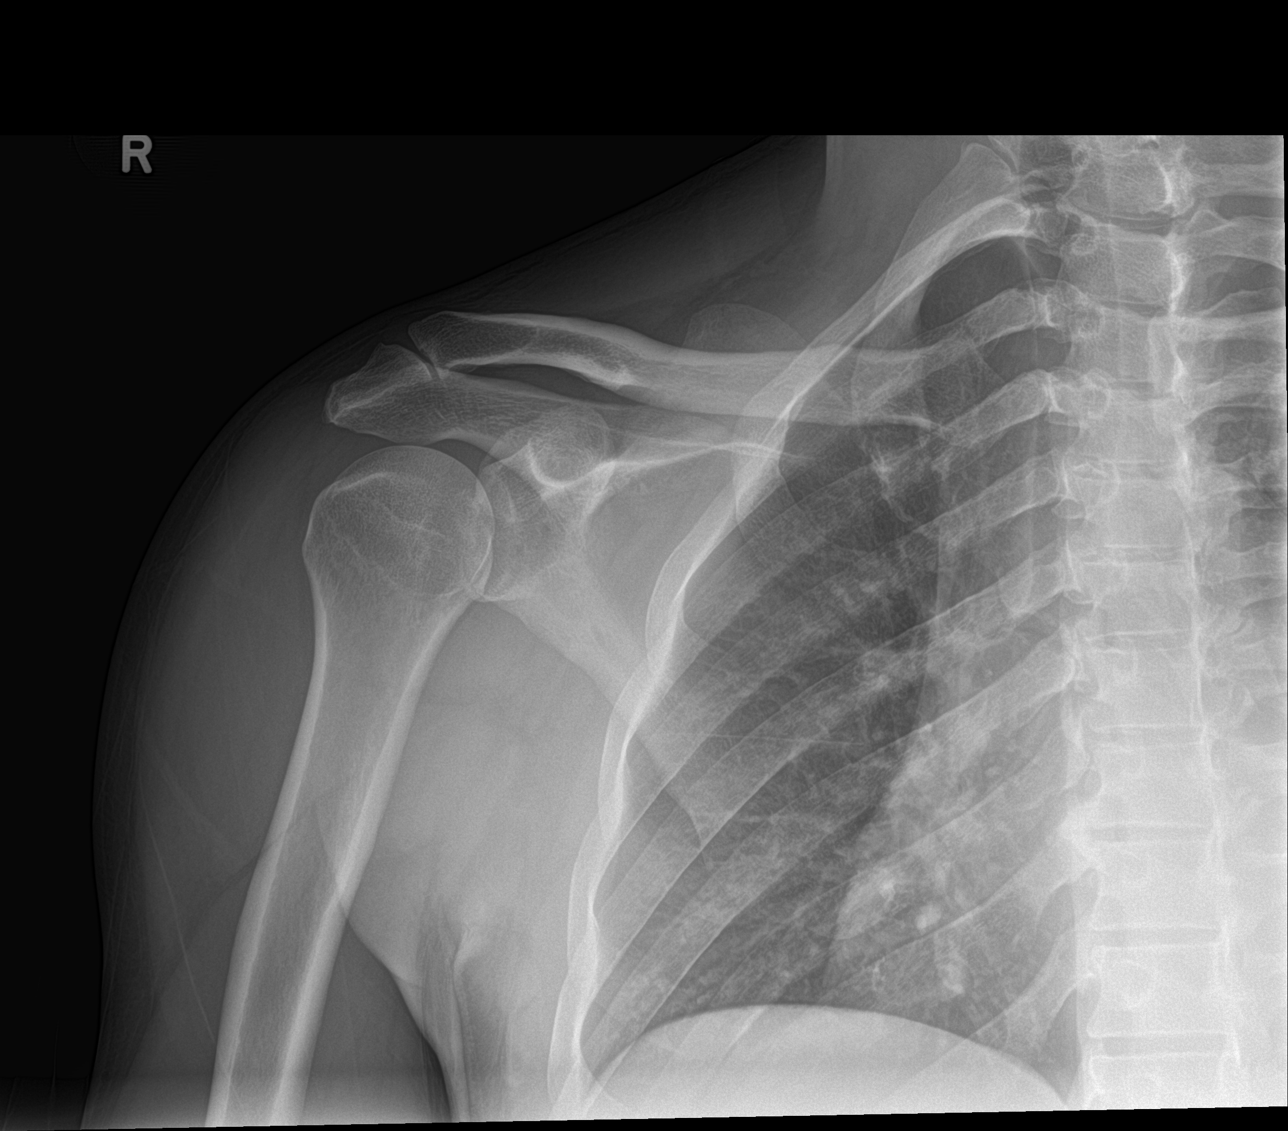

[4 of 4 positions shown; findings below may reference images not displayed]

FINDINGS: There is no evidence of fracture or dislocation. There is no
evidence of arthropathy or other focal bone abnormality. Soft
tissues are unremarkable.
IMPRESSION: No significant abnormality of the right shoulder.

## 2022-09-04 ENCOUNTER — Other Ambulatory Visit: Payer: Self-pay | Admitting: Family Medicine

## 2022-09-04 DIAGNOSIS — I10 Essential (primary) hypertension: Secondary | ICD-10-CM

## 2022-09-04 DIAGNOSIS — E7800 Pure hypercholesterolemia, unspecified: Secondary | ICD-10-CM

## 2022-09-04 MED ORDER — AMLODIPINE BESYLATE 5 MG PO TABS: 5.0000 mg | ORAL_TABLET | Freq: Every day | ORAL | 0 refills | Status: DC

## 2022-09-04 MED ORDER — ROSUVASTATIN CALCIUM 20 MG PO TABS: 20.0000 mg | ORAL_TABLET | Freq: Every day | ORAL | 0 refills | Status: DC

## 2022-09-04 NOTE — Telephone Encounter (Signed)
Requested Prescriptions  Pending Prescriptions Disp Refills  . amLODipine (NORVASC) 5 MG tablet 90 tablet 0    Sig: Take 1 tablet (5 mg total) by mouth daily.     Cardiovascular: Calcium Channel Blockers 2 Failed - 09/04/2022  1:11 PM      Failed - Valid encounter within last 6 months    Recent Outpatient Visits          11 months ago Annual physical exam   [facility], Netta Neat, DO   11 months ago Encounter for [employer] annual wellness exam   [facility], Netta Neat, DO   1 year ago Chronic right shoulder pain   [facility] Smitty Cords, DO   1 year ago Chronic right shoulder pain   [facility] Smitty Cords, DO   1 year ago Pre-diabetes   [facility], Netta Neat, DO             Passed - Last BP in normal range    BP Readings from Last 1 Encounters:  10/02/21 139/81         Passed - Last Heart Rate in normal range    Pulse Readings from Last 1 Encounters:  10/02/21 76         . rosuvastatin (CRESTOR) 20 MG tablet 90 tablet 0    Sig: Take 1 tablet (20 mg total) by mouth at bedtime.     Cardiovascular:  Antilipid - Statins 2 Failed - 09/04/2022  1:11 PM      Failed - Lipid Panel in normal range within the last 12 months    Cholesterol  Date Value Ref Range Status  09/28/2021 157 <200 mg/dL Final   LDL Cholesterol (Calc)  Date Value Ref Range Status  09/28/2021 80 mg/dL (calc) Final    Comment:    Reference range: <100 . Desirable range <100 mg/dL for primary prevention;   <70 mg/dL for patients with CHD or diabetic patients  with > or = 2 CHD risk factors. Marland Kitchen LDL-C is now calculated using the Martin-Hopkins  calculation, which is a validated novel method providing  better accuracy than the Friedewald equation in the  estimation of LDL-C.  Horald Pollen et al. Lenox Ahr. 0102;725(36): 2061-2068   (http://education.[facility].com/faq/FAQ164)    HDL  Date Value Ref Range Status  09/28/2021 63 > OR = 40 mg/dL Final   Triglycerides  Date Value Ref Range Status  09/28/2021 64 <150 mg/dL Final         Passed - Cr in normal range and within 360 days    Creat  Date Value Ref Range Status  09/28/2021 1.05 0.60 - 1.29 mg/dL Final         Passed - Patient is not pregnant      Passed - Valid encounter within last 12 months    Recent Outpatient Visits          11 months ago Annual physical exam   [facility] Smitty Cords, DO   11 months ago Encounter for [employer] annual wellness exam   [facility] Smitty Cords, DO   1 year ago Chronic right shoulder pain   [facility] Smitty Cords, DO   1 year ago Chronic right shoulder pain   [facility] Smitty Cords, DO   1 year ago Pre-diabetes   [facility] [place], Lyn Hollingshead  J, DO

## 2022-09-04 NOTE — Telephone Encounter (Signed)
Medication Refill - Medication: rosuvastatin (CRESTOR) 20 MG tablet, amLODipine (NORVASC) 5 MG tablet  Has the patient contacted their pharmacy? Yes.     Preferred Pharmacy (with phone number or street name):  [place], [place] Phone:  [phone]  Fax:  [phone]     Has the patient been seen for an appointment in the last year OR does the patient have an upcoming appointment? Yes.      The patient went out of town and left his medicine there and that is why he needs a refill. Please assist patient further

## 2022-12-05 ENCOUNTER — Other Ambulatory Visit: Payer: Self-pay

## 2022-12-05 DIAGNOSIS — R7303 Prediabetes: Secondary | ICD-10-CM

## 2022-12-05 DIAGNOSIS — E7800 Pure hypercholesterolemia, unspecified: Secondary | ICD-10-CM

## 2022-12-05 DIAGNOSIS — Z125 Encounter for screening for malignant neoplasm of prostate: Secondary | ICD-10-CM

## 2022-12-05 DIAGNOSIS — I10 Essential (primary) hypertension: Secondary | ICD-10-CM

## 2022-12-05 DIAGNOSIS — Z0000 Encounter for general adult medical examination without abnormal findings: Secondary | ICD-10-CM

## 2022-12-06 ENCOUNTER — Other Ambulatory Visit: Payer: 59

## 2022-12-07 LAB — COMPREHENSIVE METABOLIC PANEL
AG Ratio: 1.5 (calc) (ref 1.0–2.5)
ALT: 28 U/L (ref 9–46)
AST: 25 U/L (ref 10–40)
Albumin: 4.5 g/dL (ref 3.6–5.1)
Alkaline phosphatase (APISO): 74 U/L (ref 36–130)
BUN: 18 mg/dL (ref 7–25)
CO2: 28 mmol/L (ref 20–32)
Calcium: 9.6 mg/dL (ref 8.6–10.3)
Chloride: 106 mmol/L (ref 98–110)
Creat: 1.07 mg/dL (ref 0.60–1.29)
Globulin: 3.1 g/dL (calc) (ref 1.9–3.7)
Glucose, Bld: 115 mg/dL — ABNORMAL HIGH (ref 65–99)
Potassium: 4 mmol/L (ref 3.5–5.3)
Sodium: 141 mmol/L (ref 135–146)
Total Bilirubin: 0.6 mg/dL (ref 0.2–1.2)
Total Protein: 7.6 g/dL (ref 6.1–8.1)

## 2022-12-07 LAB — CBC WITH DIFFERENTIAL/PLATELET
Absolute Monocytes: 532 cells/uL (ref 200–950)
Basophils Absolute: 28 cells/uL (ref 0–200)
Basophils Relative: 0.5 %
Eosinophils Absolute: 73 cells/uL (ref 15–500)
Eosinophils Relative: 1.3 %
HCT: 40.9 % (ref 38.5–50.0)
Hemoglobin: 14.1 g/dL (ref 13.2–17.1)
Lymphs Abs: 1893 cells/uL (ref 850–3900)
MCH: 31.6 pg (ref 27.0–33.0)
MCHC: 34.5 g/dL (ref 32.0–36.0)
MCV: 91.7 fL (ref 80.0–100.0)
MPV: 11.8 fL (ref 7.5–12.5)
Monocytes Relative: 9.5 %
Neutro Abs: 3074 cells/uL (ref 1500–7800)
Neutrophils Relative %: 54.9 %
Platelets: 190 10*3/uL (ref 140–400)
RBC: 4.46 10*6/uL (ref 4.20–5.80)
RDW: 13.3 % (ref 11.0–15.0)
Total Lymphocyte: 33.8 %
WBC: 5.6 10*3/uL (ref 3.8–10.8)

## 2022-12-07 LAB — PSA: PSA: 1.13 ng/mL (ref <=4.00)

## 2022-12-07 LAB — LIPID PANEL
Cholesterol: 168 mg/dL (ref <200)
HDL: 63 mg/dL (ref >=40)
LDL Cholesterol (Calc): 86 mg/dL (calc)
Non-HDL Cholesterol (Calc): 105 mg/dL (calc) (ref <130)
Total CHOL/HDL Ratio: 2.7 (calc) (ref <5.0)
Triglycerides: 93 mg/dL (ref <150)

## 2022-12-07 LAB — HEMOGLOBIN A1C
Hgb A1c MFr Bld: 6.2 % of total Hgb — ABNORMAL HIGH (ref <5.7)
Mean Plasma Glucose: 131 mg/dL
eAG (mmol/L): 7.3 mmol/L

## 2022-12-09 ENCOUNTER — Other Ambulatory Visit: Payer: Self-pay | Admitting: Family Medicine

## 2022-12-09 DIAGNOSIS — I10 Essential (primary) hypertension: Secondary | ICD-10-CM

## 2022-12-09 DIAGNOSIS — E7800 Pure hypercholesterolemia, unspecified: Secondary | ICD-10-CM

## 2022-12-10 NOTE — Telephone Encounter (Signed)
Labs in date and has his appt. In 2 days so a 90 day refill given for both.  Requested Prescriptions  Pending Prescriptions Disp Refills   rosuvastatin (CRESTOR) 20 MG tablet [Pharmacy Med Name: Rosuvastatin Calcium 20 MG Oral Tablet] 90 tablet 0    Sig: TAKE 1 TABLET BY MOUTH AT BEDTIME     Cardiovascular:  Antilipid - Statins 2 Failed - 12/09/2022 11:00 PM      Failed - Valid encounter within last 12 months    Recent Outpatient Visits           1 year ago Annual physical exam   [place], DO   1 year ago Encounter for [employer] annual wellness exam   [place], DO   1 year ago Chronic right shoulder pain   [place], DO   1 year ago Chronic right shoulder pain   [facility] Olin Hauser, DO   1 year ago [place], DO       Future Appointments             In 2 days [place], Devonne Doughty, DO [facility]            Failed - Lipid Panel in normal range within the last 12 months    Cholesterol  Date Value Ref Range Status  12/06/2022 168 <200 mg/dL Final   LDL Cholesterol (Calc)  Date Value Ref Range Status  12/06/2022 86 mg/dL (calc) Final    Comment:    Reference range: <100 . Desirable range <100 mg/dL for primary prevention;   <70 mg/dL for patients with CHD or diabetic patients  with > or = 2 CHD risk factors. Marland Kitchen LDL-C is now calculated using the Martin-Hopkins  calculation, which is a validated novel method providing  better accuracy than the Friedewald equation in the  estimation of LDL-C.  Cresenciano Genre et al. Annamaria Helling. 2725;366(44): 2061-2068  (http://education.[facility].com/faq/FAQ164)    HDL  Date Value Ref Range Status  12/06/2022 63 > OR = 40  mg/dL Final   Triglycerides  Date Value Ref Range Status  12/06/2022 93 <150 mg/dL Final         Passed - Cr in normal range and within 360 days    Creat  Date Value Ref Range Status  12/06/2022 1.07 0.60 - 1.29 mg/dL Final         Passed - Patient is not pregnant       amLODipine (NORVASC) 5 MG tablet [Pharmacy Med Name: amLODIPine Besylate 5 MG Oral Tablet] 90 tablet 0    Sig: Take 1 tablet by mouth once daily     Cardiovascular: Calcium Channel Blockers 2 Failed - 12/09/2022 11:00 PM      Failed - Valid encounter within last 6 months    Recent Outpatient Visits           1 year ago Annual physical exam   [facility] Olin Hauser, DO   1 year ago Encounter for [employer] annual wellness exam   [place], DO   1 year ago Chronic right shoulder pain   [place], DO   1 year ago Chronic right  shoulder pain   [facility] Olin Hauser, DO   1 year ago [place], DO       Future Appointments             In 2 days [place] Devonne Doughty, DO [facility] [place] BP in normal range    BP Readings from Last 1 Encounters:  10/02/21 139/81         Passed - Last Heart Rate in normal range    Pulse Readings from Last 1 Encounters:  10/02/21 76

## 2022-12-12 ENCOUNTER — Telehealth: Payer: Self-pay

## 2022-12-12 ENCOUNTER — Ambulatory Visit (INDEPENDENT_AMBULATORY_CARE_PROVIDER_SITE_OTHER): Payer: 59 | Admitting: Family Medicine

## 2022-12-12 ENCOUNTER — Encounter: Payer: Self-pay | Admitting: Family Medicine

## 2022-12-12 VITALS — BP 120/88 | HR 77 | Ht 64.0 in | Wt 139.2 lb

## 2022-12-12 DIAGNOSIS — F79 Unspecified intellectual disabilities: Secondary | ICD-10-CM

## 2022-12-12 DIAGNOSIS — G8929 Other chronic pain: Secondary | ICD-10-CM

## 2022-12-12 DIAGNOSIS — Z23 Encounter for immunization: Secondary | ICD-10-CM

## 2022-12-12 DIAGNOSIS — I10 Essential (primary) hypertension: Secondary | ICD-10-CM | POA: Diagnosis not present

## 2022-12-12 DIAGNOSIS — Z0000 Encounter for general adult medical examination without abnormal findings: Secondary | ICD-10-CM

## 2022-12-12 DIAGNOSIS — R7303 Prediabetes: Secondary | ICD-10-CM

## 2022-12-12 DIAGNOSIS — M25511 Pain in right shoulder: Secondary | ICD-10-CM

## 2022-12-12 DIAGNOSIS — M7541 Impingement syndrome of right shoulder: Secondary | ICD-10-CM

## 2022-12-12 DIAGNOSIS — E7800 Pure hypercholesterolemia, unspecified: Secondary | ICD-10-CM

## 2022-12-12 DIAGNOSIS — R519 Headache, unspecified: Secondary | ICD-10-CM

## 2022-12-12 MED ORDER — AMLODIPINE BESYLATE 5 MG PO TABS: 5.0000 mg | ORAL_TABLET | Freq: Every day | ORAL | 3 refills | Status: DC

## 2022-12-12 MED ORDER — MELOXICAM 15 MG PO TABS: 15.0000 mg | ORAL_TABLET | Freq: Every day | ORAL | 3 refills | Status: DC | PRN

## 2022-12-12 MED ORDER — ROSUVASTATIN CALCIUM 20 MG PO TABS: 20.0000 mg | ORAL_TABLET | Freq: Every day | ORAL | 3 refills | Status: DC

## 2022-12-12 MED ORDER — BACLOFEN 10 MG PO TABS: 10.0000 mg | ORAL_TABLET | Freq: Three times a day (TID) | ORAL | 3 refills | Status: DC | PRN

## 2022-12-12 NOTE — Assessment & Plan Note (Signed)
Stable chronic problem Functions well especially with assistance of friend and caregiver

## 2022-12-12 NOTE — Progress Notes (Signed)
Subjective:    Patient ID: Peter Mcmahon, male    DOB: 1976-07-28, 47 y.o.   MRN: [id number]  Peter Mcmahon is a 47 y.o. male presenting on 12/12/2022 for Annual Exam   HPI  Here for Annual Physical and Lab Review. With caregiver, [name]  They have relocated to the area.   Pre-Diabetes Last A1c 5.8 now up to A1c 6.2, stable range. Not checking CBG Meds: Never on med Lifestyle: - Family history of Diabetes - Diet (Still trying to improve low carb diet now less sweets, caregiver helps reduce his sweets, - Exercise (activity, some walking) Denies hypoglycemia, polyuria, visual changes, numbness or tingling.   CHRONIC HTN Reports prior history of elevated BP in past. Home BP readings are under control, doing well. Today doing better on lower dose med Current Meds - Amlodipine 5mg  daily Fam history mother and grandmother on mom side HTN - sister Admits headaches Denies CP, dyspnea, edema, dizziness / lightheadedness   HYPERLIPIDEMIA: - Reports no concerns. Last lipid panel 11/2022, controlled on statin now, previously 180 to 200. Now LDL 86 - Currently taking Rosuvastatin 20mg , tolerating well without side effects or myalgias   History of Back Pain Flare up Last treatment back in May 2020 at ED given baclofen and meloxicam PRN. These helped, out of meds now. - can occasionally get back pain flare up Needs refill on medications.   Chronic R Shoulder Pain Followed by Orthopedics [facility] - has seen Dr June 2020, had [facility] guided inj 07/2021 some benefit but not resolved. Asking about dose adjust on Baclofen 1-2 times per day now On meloxicam PRN   Hearing Loss, chronic L ear L ear deaf. R ear 80% Has hearing aids.   Additional problem Occipital Headache >10 years Episodic, few times a week Asking about consultation. Has tried Excedrin migraine without as much relief. Taking some Tylenol AS NEEDED Drinks some coffee and caffeine daily.   Health Maintenance:   Colon CA  Screening: Never had colonoscopy. Currently asymptomatic. Known family history of colon CA, father age >12.  Medicare will not cover until age >37+   Due for Flu Shot, will receive today    UTD COVID19 vaccine, will request copy of card   PSA screening 1.13 negative 11/2022 (prior 1.22)     09/25/2021   10:43 AM 07/24/2021    8:50 AM 03/27/2021    2:11 PM  Depression screen PHQ 2/9  Decreased Interest 0 0 0  Down, Depressed, Hopeless 0 0 0  PHQ - 2 Score 0 0 0  Altered sleeping  0 0  Tired, decreased energy  0 0  Change in appetite  0 0  Feeling bad or failure about yourself   0 0  Trouble concentrating  0 0  Moving slowly or fidgety/restless  0 0  Suicidal thoughts  0 0  PHQ-9 Score  0 0  Difficult doing work/chores  Not difficult at all Not difficult at all    Past Medical History:  Diagnosis Date   Hypertension    History reviewed. No pertinent surgical history. Social History   Socioeconomic History   Marital status: Single    Spouse name: Not on file   Number of children: Not on file   Years of education: Not on file   Highest education level: High school graduate  Occupational History   Occupation: disability   Tobacco Use   Smoking status: Never   Smokeless tobacco: Never  Vaping Use   Vaping Use: Never used  Substance and Sexual Activity   Alcohol use: Never   Drug use: Never   Sexual activity: Not Currently  Other Topics Concern   Not on file  Social History Narrative   Attends church and sees friends or family often    Social Determinants of Health   Financial Resource Strain: Low Risk  (09/25/2021)   Overall Financial Resource Strain (CARDIA)    Difficulty of Paying Living Expenses: Not hard at all  Food Insecurity: No Food Insecurity (09/25/2021)   Hunger Vital Sign    Worried About Running Out of Food in the Last Year: Never true    [place] in the Last Year: Never true  Transportation Needs: No Transportation Needs (09/25/2021)    PRAPARE - Hydrologist (Medical): No    Lack of Transportation (Non-Medical): No  Physical Activity: Insufficiently Active (09/25/2021)   Exercise Vital Sign    Days of Exercise per Week: 3 days    Minutes of Exercise per Session: 20 min  Stress: No Stress Concern Present (09/25/2021)   [place]    Feeling of Stress : Not at all  Social Connections: Moderately Isolated (09/25/2021)   Social Connection and Isolation Panel [NHANES]    Frequency of Communication with Friends and Family: Twice a week    Frequency of Social Gatherings with Friends and Family: More than three times a week    Attends Religious Services: More than 4 times per year    Active Member of [employer] or Organizations: No    Attends Archivist Meetings: Never    Marital Status: Never married  Intimate Partner Violence: Not At Risk (09/25/2021)   Humiliation, Afraid, Rape, and Kick questionnaire    Fear of Current or Ex-Partner: No    Emotionally Abused: No    Physically Abused: No    Sexually Abused: No   Family History  Problem Relation Age of Onset   Diabetes Father    Colon cancer Father    Hypertension Mother    Diabetes Sister    Diabetes Brother    Diabetes Paternal Uncle    Diabetes Paternal Grandfather    Prostate cancer Neg Hx    No current outpatient medications on file prior to visit.   No current facility-administered medications on file prior to visit.    Review of Systems  Constitutional:  Negative for activity change, appetite change, chills, diaphoresis, fatigue and fever.  HENT:  Negative for congestion and hearing loss.   Eyes:  Negative for visual disturbance.  Respiratory:  Negative for cough, chest tightness, shortness of breath and wheezing.   Cardiovascular:  Negative for chest pain, palpitations and leg swelling.  Gastrointestinal:  Negative for abdominal pain, constipation,  diarrhea, nausea and vomiting.  Genitourinary:  Negative for dysuria, frequency and hematuria.  Musculoskeletal:  Positive for arthralgias. Negative for neck pain.  Skin:  Negative for rash.  Neurological:  Positive for headaches. Negative for dizziness, weakness, light-headedness and numbness.  Hematological:  Negative for adenopathy.  Psychiatric/Behavioral:  Negative for behavioral problems, dysphoric mood and sleep disturbance.    Per HPI unless specifically indicated above      Objective:    BP 120/88   Pulse 77   Ht 5\' 4"  (1.626 m)   Wt 139 lb 3.2 oz (63.1 kg)   SpO2 100%   BMI 23.89 kg/m   Wt Readings from Last 3 Encounters:  12/12/22 139 lb  3.2 oz (63.1 kg)  10/02/21 148 lb 3.2 oz (67.2 kg)  07/24/21 142 lb 9.6 oz (64.7 kg)    Physical Exam Vitals and nursing note reviewed.  Constitutional:      General: He is not in acute distress.    Appearance: Normal appearance. He is well-developed. He is not diaphoretic.     Comments: Well-appearing, comfortable, cooperative  HENT:     Head: Normocephalic and atraumatic.  Eyes:     General:        Right eye: No discharge.        Left eye: No discharge.     Conjunctiva/sclera: Conjunctivae normal.  Cardiovascular:     Rate and Rhythm: Normal rate.  Pulmonary:     Effort: Pulmonary effort is normal.  Musculoskeletal:     Comments: R Shoulder mostly intact ROM has some pain with full flexion abduction. Positive impingement testing.  Skin:    General: Skin is warm and dry.     Findings: No erythema or rash.  Neurological:     Mental Status: He is alert and oriented to person, place, and time.  Psychiatric:        Mood and Affect: Mood normal.        Behavior: Behavior normal.        Thought Content: Thought content normal.     Comments: Well groomed, good eye contact, limited speech but active in visit and follows commands, at his baseline      Results for orders placed or performed in visit on 12/05/22  PSA   Result Value Ref Range   PSA 1.13 < OR = 4.00 ng/mL  Lipid panel  Result Value Ref Range   Cholesterol 168 <200 mg/dL   HDL 63 > OR = 40 mg/dL   Triglycerides 93 <150 mg/dL   LDL Cholesterol (Calc) 86 mg/dL (calc)   Total CHOL/HDL Ratio 2.7 <5.0 (calc)   Non-HDL Cholesterol (Calc) 105 <130 mg/dL (calc)  Comprehensive Metabolic Panel (CMET)  Result Value Ref Range   Glucose, Bld 115 (H) 65 - 99 mg/dL   BUN 18 7 - 25 mg/dL   Creat 1.07 0.60 - 1.29 mg/dL   BUN/Creatinine Ratio SEE NOTE: 6 - 22 (calc)   Sodium 141 135 - 146 mmol/L   Potassium 4.0 3.5 - 5.3 mmol/L   Chloride 106 98 - 110 mmol/L   CO2 28 20 - 32 mmol/L   Calcium 9.6 8.6 - 10.3 mg/dL   Total Protein 7.6 6.1 - 8.1 g/dL   Albumin 4.5 3.6 - 5.1 g/dL   Globulin 3.1 1.9 - 3.7 g/dL (calc)   AG Ratio 1.5 1.0 - 2.5 (calc)   Total Bilirubin 0.6 0.2 - 1.2 mg/dL   Alkaline phosphatase (APISO) 74 36 - 130 U/L   AST 25 10 - 40 U/L   ALT 28 9 - 46 U/L  CBC with Differential  Result Value Ref Range   WBC 5.6 3.8 - 10.8 Thousand/uL   RBC 4.46 4.20 - 5.80 Million/uL   Hemoglobin 14.1 13.2 - 17.1 g/dL   HCT 40.9 38.5 - 50.0 %   MCV 91.7 80.0 - 100.0 fL   MCH 31.6 27.0 - 33.0 pg   MCHC 34.5 32.0 - 36.0 g/dL   RDW 13.3 11.0 - 15.0 %   Platelets 190 140 - 400 Thousand/uL   MPV 11.8 7.5 - 12.5 fL   Neutro Abs 3,074 1,500 - 7,800 cells/uL   Lymphs Abs 1,893 850 - 3,900 cells/uL  Absolute Monocytes 532 200 - 950 cells/uL   Eosinophils Absolute 73 15 - 500 cells/uL   Basophils Absolute 28 0 - 200 cells/uL   Neutrophils Relative % 54.9 %   Total Lymphocyte 33.8 %   Monocytes Relative 9.5 %   Eosinophils Relative 1.3 %   Basophils Relative 0.5 %  HgB A1c  Result Value Ref Range   Hgb A1c MFr Bld 6.2 (H) <5.7 % of total Hgb   Mean Plasma Glucose 131 mg/dL   eAG (mmol/L) 7.3 mmol/L      Assessment & Plan:   Problem List Items Addressed This Visit     Chronic right shoulder pain    Chronic Right Shoulder Pain Previously  followed by [facility] Completed injection in 2022 by Dr Candelaria Stagers, has not returned.  Advised them to call and re-schedule to return to follow up on this since not resolved. Re order Meloxicam AS NEEDED use and Baclofen PRN Meloxicam is daily as needed, prefer to avoid frequent use      Relevant Medications   baclofen (LIORESAL) 10 MG tablet   meloxicam (MOBIC) 15 MG tablet   Essential hypertension    Controlled HTN on amlodipine No known complications     Plan:  1. Continue Amlodipine 10mg  daily 2. Encourage improved lifestyle - low sodium diet, regular exercise 3. Continue monitor BP outside office, bring readings to next visit, if persistently >140/90 or new symptoms notify office sooner      Relevant Medications   amLODipine (NORVASC) 5 MG tablet   rosuvastatin (CRESTOR) 20 MG tablet   Hypercholesteremia    Controlled HLD on statin LDL down from >200 down to 80  Plan: 1. Continue Rosuvastatin 20mg  daily 2. Encourage improved lifestyle - low carb/cholesterol, reduce portion size, continue improving regular exercise Follow-up yearly      Relevant Medications   amLODipine (NORVASC) 5 MG tablet   rosuvastatin (CRESTOR) 20 MG tablet   Intellectual disability    Stable chronic problem Functions well especially with assistance of friend and caregiver      Pre-diabetes    Elevated A1c with Pre Diabetes Prior 5 range, now up to 6.2 Attributed to poor diet habits with inc sweet intake Emphasis on reducing sweets/carb/starches Repeat A1c 6 months      Other Visit Diagnoses     Annual physical exam    -  Primary   Needs flu shot       Relevant Orders   Flu Vaccine QUAD 23mo+IM (Fluarix, Fluzone & Alfiuria Quad PF) (Completed)   Rotator cuff impingement syndrome, right       Relevant Medications   baclofen (LIORESAL) 10 MG tablet   meloxicam (MOBIC) 15 MG tablet   Chronic nonintractable headache, unspecified headache type       Relevant Medications    amLODipine (NORVASC) 5 MG tablet   baclofen (LIORESAL) 10 MG tablet   meloxicam (MOBIC) 15 MG tablet   Other Relevant Orders   Ambulatory referral to Neurology   Recurrent occipital headache       Relevant Medications   amLODipine (NORVASC) 5 MG tablet   baclofen (LIORESAL) 10 MG tablet   meloxicam (MOBIC) 15 MG tablet   Other Relevant Orders   Ambulatory referral to Neurology       Updated Health Maintenance information Reviewed recent lab results with patient Encouraged improvement to lifestyle with diet and exercise Goal of weight loss  chronic occipital headache >10 years, episodic. History of intellectual disability, difficulty with history,  patient has chronic shoulder pain bursitis impingement and some neck symptoms. Does not seem to have migraine features. He has been treated for his shoulder and hypertension. Requesting further Neuro consult on chronic posterior headache.    Orders Placed This Encounter  Procedures   Flu Vaccine QUAD 56mo+IM (Fluarix, Fluzone & Alfiuria Quad PF)   Ambulatory referral to Neurology    Referral Priority:   Routine    Referral Type:   Consultation    Referral Reason:   Specialty Services Required    Referred to Provider:   Vladimir Crofts, MD    Requested Specialty:   Neurology    Number of Visits Requested:   1      Meds ordered this encounter  Medications   amLODipine (NORVASC) 5 MG tablet    Sig: Take 1 tablet (5 mg total) by mouth daily.    Dispense:  90 tablet    Refill:  3   baclofen (LIORESAL) 10 MG tablet    Sig: Take 1 tablet (10 mg total) by mouth 3 (three) times daily as needed for muscle spasms.    Dispense:  270 each    Refill:  3    Increase frequency and change to 90 day   meloxicam (MOBIC) 15 MG tablet    Sig: Take 1 tablet (15 mg total) by mouth daily as needed for pain.    Dispense:  30 tablet    Refill:  3   rosuvastatin (CRESTOR) 20 MG tablet    Sig: Take 1 tablet (20 mg total) by mouth at bedtime.     Dispense:  90 tablet    Refill:  3   Follow up plan: Return in about 6 months (around 06/12/2023) for 6 month PreDM A1c, Updates Headaches, Shoulder.  Nobie Putnam, [facility] 12/12/2022, 9:51 AM

## 2022-12-12 NOTE — Assessment & Plan Note (Signed)
Controlled HLD on statin LDL down from >200 down to 80  Plan: 1. Continue Rosuvastatin 20mg  daily 2. Encourage improved lifestyle - low carb/cholesterol, reduce portion size, continue improving regular exercise Follow-up yearly

## 2022-12-12 NOTE — Assessment & Plan Note (Signed)
Chronic Right Shoulder Pain Previously followed by [facility] Completed injection in 2022 by Dr Candelaria Stagers, has not returned.  Advised them to call and re-schedule to return to follow up on this since not resolved. Re order Meloxicam AS NEEDED use and Baclofen PRN Meloxicam is daily as needed, prefer to avoid frequent use

## 2022-12-12 NOTE — Telephone Encounter (Signed)
Copied from [place] [id number]. Topic: General - Inquiry >> Dec 12, 2022  1:43 PM Marcellus Scott wrote: Reason for CRM: Latoya from [facility] stated pt advised them that Dr. Raliegh Ip referred him. Latoya stated because pt stated he had a lot of pain (headache). Pt has been scheduled, was put on Dr.Shah's schedule; however, a referral is needed because of pt insurance.  Requesting a referral as soon as possible electronically or by fax: [phone]

## 2022-12-12 NOTE — Telephone Encounter (Signed)
Acknowledged.  The referral has not been submitted yet because I am still working on his chart today 12/12/22.  Note - I did advise them that the referral will be ordered by end of day and to allow 2-3 business days for the Neurologist office to review and contact the patient to schedule.  I was not anticipating that they would call the Neurologist office immediately.  Peter Mcmahon, [facility] 12/12/2022, 3:42 PM

## 2022-12-12 NOTE — Patient Instructions (Addendum)
Thank you for coming to the office today.  Recent Labs    12/06/22 0804  HGBA1C 6.2*   Keep reducing sweets / carbs / sugars. Goal to keep the A1c back down into the 5 range with Pre Diabetes.  Safe to take Baclofen as needed.  Meloxicam is daily as needed, prefer to avoid frequent use. It can damage kidneys. Take only on occasion.   If you can - call up the [facility] seen 07/30/21 for Right Shoulder Pain. He had an injection at that time. Just tell them to schedule a new apt for same problem, as it has not resolved.  Rosalia Hammers, [address]  [place], [place] [address]  [phone] (Work)   --------------------------  Referral to Neurology for Headache.  Okay to take Tylenol and Excedrin.  [facility] Neurology [facility] [place], [place] [address] Phone: [phone]   If you need a new referral, let me know I can re order it. Otherwise they should be able to handle the scheduling.   Please schedule a Follow-up Appointment to: Return in about 6 months (around 06/12/2023) for 6 month PreDM A1c, Updates Headaches, Shoulder.  If you have any other questions or concerns, please feel free to call the office or send a message through [place]. You may also schedule an earlier appointment if necessary.  Additionally, you may be receiving a survey about your experience at our office within a few days to 1 week by e-mail or mail. We value your feedback.  Nobie Putnam, DO [place]

## 2022-12-12 NOTE — Assessment & Plan Note (Signed)
Elevated A1c with Pre Diabetes Prior 5 range, now up to 6.2 Attributed to poor diet habits with inc sweet intake Emphasis on reducing sweets/carb/starches Repeat A1c 6 months

## 2022-12-12 NOTE — Assessment & Plan Note (Addendum)
Controlled HTN on amlodipine No known complications     Plan:  1. Continue Amlodipine 10mg  daily 2. Encourage improved lifestyle - low sodium diet, regular exercise 3. Continue monitor BP outside office, bring readings to next visit, if persistently >140/90 or new symptoms notify office sooner

## 2023-01-03 ENCOUNTER — Other Ambulatory Visit: Payer: Self-pay | Admitting: Neurology

## 2023-01-03 DIAGNOSIS — G44221 Chronic tension-type headache, intractable: Secondary | ICD-10-CM

## 2023-01-05 ENCOUNTER — Ambulatory Visit
Admission: RE | Admit: 2023-01-05 | Discharge: 2023-01-05 | Disposition: A | Discharge status: Home / Self Care | Payer: 59 | Source: Ambulatory Visit | Attending: Neurology | Admitting: Neurology

## 2023-01-05 DIAGNOSIS — G44221 Chronic tension-type headache, intractable: Secondary | ICD-10-CM | POA: Diagnosis present

## 2023-01-05 MED ORDER — GADOBUTROL 1 MMOL/ML IV SOLN
6.0000 mL | Freq: Once | INTRAVENOUS | Status: AC | PRN
  Administered 2023-01-05: 7.5 mL via INTRAVENOUS

## 2023-03-28 ENCOUNTER — Telehealth: Payer: Self-pay | Admitting: Family Medicine

## 2023-03-28 NOTE — Telephone Encounter (Signed)
abdominal wallCalled patient to schedule Medicare Annual Wellness Visit (AWV). Left message for patient to call back and schedule Medicare Annual Wellness Visit (AWV).  Last date of AWV: 09/25/21  Please schedule an appointment at any time with Kennedy Bucker, LPN  .  If any questions, please contact me.  Thank you ,  Verlee Rossetti; Care Guide Ambulatory Clinical Support  l [facility] Direct Dial: [phone]

## 2023-06-13 ENCOUNTER — Encounter: Payer: Self-pay | Admitting: Family Medicine

## 2023-06-13 ENCOUNTER — Ambulatory Visit: Payer: 59 | Admitting: Family Medicine

## 2023-06-13 ENCOUNTER — Other Ambulatory Visit: Payer: Self-pay | Admitting: Family Medicine

## 2023-06-13 VITALS — BP 132/82 | HR 81 | Ht 64.0 in | Wt 146.0 lb

## 2023-06-13 DIAGNOSIS — R7303 Prediabetes: Secondary | ICD-10-CM

## 2023-06-13 DIAGNOSIS — I10 Essential (primary) hypertension: Secondary | ICD-10-CM

## 2023-06-13 DIAGNOSIS — Z0000 Encounter for general adult medical examination without abnormal findings: Secondary | ICD-10-CM

## 2023-06-13 DIAGNOSIS — E7800 Pure hypercholesterolemia, unspecified: Secondary | ICD-10-CM

## 2023-06-13 DIAGNOSIS — Z125 Encounter for screening for malignant neoplasm of prostate: Secondary | ICD-10-CM

## 2023-06-13 LAB — POCT GLYCOSYLATED HEMOGLOBIN (HGB A1C): Hemoglobin A1C: 5.9 % — AB (ref 4.0–5.6)

## 2023-06-13 NOTE — Progress Notes (Signed)
Subjective:    Patient ID: Peter Mcmahon, male    DOB: [birth year], 47 y.o.   MRN: [id number]  Peter Mcmahon is a 47 y.o. male presenting on 06/13/2023 for Medical Management of Chronic Issues   HPI  Pre-Diabetes Prior result A1c 6.2, now improved to 5.9 today Improving exercise, more water intake, and salads and avoiding sugar and carbs.  Not checking CBG Lifestyle: - Family history of Diabetes - Diet (Still trying to improve low carb diet now less sweets, caregiver helps reduce his sweets, - Exercise (activity, some walking) Denies hypoglycemia, polyuria, visual changes, numbness or tingling.   CHRONIC HTN Reports prior history of elevated BP in past. Home BP readings are under control, doing well. Today doing better on lower dose med Current Meds - Amlodipine 5mg  daily Fam history mother and grandmother on mom side HTN - sister Admits headaches Denies CP, dyspnea, edema, dizziness / lightheadedness   HYPERLIPIDEMIA: - Reports no concerns. Last lipid panel 11/2022, controlled on statin now, previously 180 to 200. Now LDL 86 - Currently taking Rosuvastatin 20mg , tolerating well without side effects or myalgias   History of Back Pain Flare up Last treatment back in May 2020 at ED given baclofen and meloxicam PRN. These helped, out of meds now. - can occasionally get back pain flare up Needs refill on medications.   Chronic R Shoulder Pain Followed by Orthopedics [facility] - has seen Dr Landry Mellow, had US guided inj 07/2021 some benefit but not resolved. 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   Health Maintenance  Future age 50+ for Colonoscopy, he has fam history father passed of colon CA     09/25/2021   10:43 AM 07/24/2021    8:50 AM 03/27/2021    2:11 PM  Depression screen PHQ 2/9  Decreased Interest 0 0 0  Down, Depressed, Hopeless 0 0 0  PHQ - 2 Score 0 0 0  Altered sleeping  0 0  Tired, decreased energy  0 0   Change in appetite  0 0  Feeling bad or failure about yourself   0 0  Trouble concentrating  0 0  Moving slowly or fidgety/restless  0 0  Suicidal thoughts  0 0  PHQ-9 Score  0 0  Difficult doing work/chores  Not difficult at all Not difficult at all    Social History   Tobacco Use   Smoking status: Never   Smokeless tobacco: Never  Vaping Use   Vaping status: Never Used  Substance Use Topics   Alcohol use: Never   Drug use: Never    Review of Systems Per HPI unless specifically indicated above     Objective:    BP 132/82   Pulse 81   Ht 5\' 4"  (1.626 m)   Wt 146 lb (66.2 kg)   SpO2 100%   BMI 25.06 kg/m   Wt Readings from Last 3 Encounters:  06/13/23 146 lb (66.2 kg)  12/12/22 139 lb 3.2 oz (63.1 kg)  10/02/21 148 lb 3.2 oz (67.2 kg)    Physical Exam Vitals and nursing note reviewed.  Constitutional:      General: He is not in acute distress.    Appearance: Normal appearance. He is well-developed. He is not diaphoretic.     Comments: Well-appearing, comfortable, cooperative  HENT:     Head: Normocephalic and atraumatic.  Eyes:     General:  Right eye: No discharge.        Left eye: No discharge.     Conjunctiva/sclera: Conjunctivae normal.  Cardiovascular:     Rate and Rhythm: Normal rate.  Pulmonary:     Effort: Pulmonary effort is normal.  Skin:    General: Skin is warm and dry.     Findings: No erythema or rash.  Neurological:     Mental Status: He is alert and oriented to person, place, and time.  Psychiatric:        Mood and Affect: Mood normal.        Behavior: Behavior normal.        Thought Content: Thought content normal.     Comments: Well groomed, good eye contact, limited speech but active in visit and follows commands, at his baseline       Results for orders placed or performed in visit on 06/13/23  POCT glycosylated hemoglobin (Hb A1C)  Result Value Ref Range   Hemoglobin A1C 5.9 (A) 4.0 - 5.6 %      Assessment &  Plan:   Problem List Items Addressed This Visit     Essential hypertension    Controlled HTN on amlodipine No known complications     Plan:  1. Continue Amlodipine 10mg  daily 2. Encourage improved lifestyle - low sodium diet, regular exercise 3. Continue monitor BP outside office, bring readings to next visit, if persistently >140/90 or new symptoms notify office sooner      Pre-diabetes - Primary    Elevated A1c with Pre Diabetes A1c improved to 5.9 Emphasis on reducing sweets/carb/starches Repeat A1c 6 months      Relevant Orders   POCT glycosylated hemoglobin (Hb A1C) (Completed)    Orders Placed This Encounter  Procedures   POCT glycosylated hemoglobin (Hb A1C)     No orders of the defined types were placed in this encounter.     Follow up plan: Return in about 6 months (around 12/14/2023) for 6 month fasting lab then 1 week later Annual Physical.  Future labs ordered for 11/2023   Saralyn Pilar, DO [facility] 06/13/2023, 2:53 PM

## 2023-06-13 NOTE — Patient Instructions (Addendum)
Thank you for coming to the office today.  No change to medications  Recent Labs    12/06/22 0804 06/13/23 1454  HGBA1C 6.2* 5.9*   Keep up the great work  BP reading is normal.  DUE for FASTING BLOOD WORK (no food or drink after midnight before the lab appointment, only water or coffee without cream/sugar on the morning of)  SCHEDULE "Lab Only" visit in the morning at the clinic for lab draw in 6 MONTHS   - Make sure Lab Only appointment is at about 1 week before your next appointment, so that results will be available  For Lab Results, once available within 2-3 days of blood draw, you can can log in to MyChart online to view your results and a brief explanation. Also, we can discuss results at next follow-up visit.   Please schedule a Follow-up Appointment to: Return in about 6 months (around 12/14/2023) for 6 month fasting lab then 1 week later Annual Physical.  If you have any other questions or concerns, please feel free to call the office or send a message through MyChart. You may also schedule an earlier appointment if necessary.  Additionally, you may be receiving a survey about your experience at our office within a few days to 1 week by e-mail or mail. We value your feedback.  Saralyn Pilar, DO [facility], [place]

## 2023-06-13 NOTE — Assessment & Plan Note (Signed)
Elevated A1c with Pre Diabetes A1c improved to 5.9 Emphasis on reducing sweets/carb/starches Repeat A1c 6 months

## 2023-06-13 NOTE — Assessment & Plan Note (Signed)
Controlled HTN on amlodipine No known complications     Plan:  1. Continue Amlodipine 10mg  daily 2. Encourage improved lifestyle - low sodium diet, regular exercise 3. Continue monitor BP outside office, bring readings to next visit, if persistently >140/90 or new symptoms notify office sooner

## 2023-08-07 ENCOUNTER — Ambulatory Visit (INDEPENDENT_AMBULATORY_CARE_PROVIDER_SITE_OTHER): Payer: 59

## 2023-08-07 DIAGNOSIS — Z0000 Encounter for general adult medical examination without abnormal findings: Secondary | ICD-10-CM

## 2023-08-07 NOTE — Progress Notes (Signed)
Subjective:   Peter Mcmahon is a 47 y.o. male who presents for Medicare Annual/Subsequent preventive examination.  Visit Complete: Virtual  I connected with  Peter Mcmahon on 08/07/23 by a audio enabled telemedicine application and verified that I am speaking with the correct person using two identifiers.  Patient Location: Home  Provider Location: Office/Clinic  I discussed the limitations of evaluation and management by telemedicine. The patient expressed understanding and agreed to proceed.  Vital Signs: Unable to obtain new vitals due to this being a telehealth visit.  Cardiac Risk Factors include: hypertension;male gender     Objective:    There were no vitals filed for this visit. There is no height or weight on file to calculate BMI.     08/07/2023    3:10 PM 09/25/2021   10:29 AM 01/11/2020   10:50 AM 04/14/2019    9:27 PM 01/05/2019    3:01 PM  Advanced Directives  Does Patient Have a Medical Advance Directive? No No No No No  Would patient like information on creating a medical advance directive? No - Patient declined No - Patient declined  No - Patient declined Yes (MAU/Ambulatory/Procedural Areas - Information given)    Current Medications (verified) Outpatient Encounter Medications as of 08/07/2023  Medication Sig   amLODipine (NORVASC) 5 MG tablet Take 1 tablet (5 mg total) by mouth daily.   meloxicam (MOBIC) 15 MG tablet Take 1 tablet (15 mg total) by mouth daily as needed for pain.   rosuvastatin (CRESTOR) 20 MG tablet Take 1 tablet (20 mg total) by mouth at bedtime.   baclofen (LIORESAL) 10 MG tablet Take 1 tablet (10 mg total) by mouth 3 (three) times daily as needed for muscle spasms. (Patient not taking: Reported on 08/07/2023)   No facility-administered encounter medications on file as of 08/07/2023.    Allergies (verified) Patient has no known allergies.   History: Past Medical History:  Diagnosis Date   Hypertension    History reviewed. No  pertinent surgical history. Family History  Problem Relation Age of Onset   Diabetes Father    Colon cancer Father    Hypertension Mother    Diabetes Sister    Diabetes Brother    Diabetes Paternal Uncle    Diabetes Paternal Grandfather    Prostate cancer Neg Hx    Social History   Socioeconomic History   Marital status: Single    Spouse name: Not on file   Number of children: Not on file   Years of education: Not on file   Highest education level: High school graduate  Occupational History   Occupation: disability   Tobacco Use   Smoking status: Never   Smokeless tobacco: Never  Vaping Use   Vaping status: Never Used  Substance and Sexual Activity   Alcohol use: Never   Drug use: Never   Sexual activity: Not Currently  Other Topics Concern   Not on file  Social History Narrative   Attends church and sees friends or family often    Social Determinants of Health   Financial Resource Strain: Low Risk  (08/07/2023)   Overall Financial Resource Strain (CARDIA)    Difficulty of Paying Living Expenses: Not hard at all  Food Insecurity: No Food Insecurity (08/07/2023)   Hunger Vital Sign    Worried About Running Out of Food in the Last Year: Never true    Ran Out of Food in the Last Year: Never true  Transportation Needs: No Transportation Needs (08/07/2023)  PRAPARE - Administrator, Civil Service (Medical): No    Lack of Transportation (Non-Medical): No  Physical Activity: Insufficiently Active (08/07/2023)   Exercise Vital Sign    Days of Exercise per Week: 3 days    Minutes of Exercise per Session: 30 min  Stress: No Stress Concern Present (08/07/2023)   [employer] of Occupational Health - Occupational Stress Questionnaire    Feeling of Stress : Not at all  Social Connections: Socially Isolated (08/07/2023)   Social Connection and Isolation Panel [NHANES]    Frequency of Communication with Friends and Family: Twice a week    Frequency of Social  Gatherings with Friends and Family: Once a week    Attends Religious Services: Never    Database administrator or Organizations: No    Attends Engineer, structural: Never    Marital Status: Never married    Tobacco Counseling Counseling given: Not Answered   Clinical Intake:  Pre-visit preparation completed: Yes  Pain : No/denies pain     Nutritional Status: BMI of 19-24  Normal Nutritional Risks: None Diabetes: No  How often do you need to have someone help you when you read instructions, pamphlets, or other written materials from your doctor or pharmacy?: 1 - Never  Interpreter Needed?: No  Information entered by :: Kennedy Bucker, LPN   Activities of Daily Living    08/07/2023    3:11 PM  In your present state of health, do you have any difficulty performing the following activities:  Hearing? 1  Vision? 0  Difficulty concentrating or making decisions? 0  Walking or climbing stairs? 0  Dressing or bathing? 0  Doing errands, shopping? 0  Preparing Food and eating ? N  Using the Toilet? N  In the past six months, have you accidently leaked urine? N  Do you have problems with loss of bowel control? N  Managing your Medications? N  Managing your Finances? N  Housekeeping or managing your Housekeeping? N    Patient Care Team: Smitty Cords, DO as PCP - General (Family Medicine)  Indicate any recent Medical Services you may have received from other than [facility] providers in the past year (date may be approximate).     Assessment:   This is a routine wellness examination for Peter Mcmahon.  Hearing/Vision screen Hearing Screening - Comments:: wears aids Vision Screening - Comments:: No glasses   Goals Addressed             This Visit's Progress    DIET - EAT MORE FRUITS AND VEGETABLES         Depression Screen    08/07/2023    3:09 PM 09/25/2021   10:43 AM 07/24/2021    8:50 AM 03/27/2021    2:11 PM 09/27/2020    2:01 PM 02/14/2020    10:49 AM 01/11/2020   10:52 AM  PHQ 2/9 Scores  PHQ - 2 Score 0 0 0 0 0 0 0  PHQ- 9 Score 0  0 0       Fall Risk    08/07/2023    3:11 PM 09/25/2021   10:29 AM 07/24/2021    8:49 AM 03/27/2021    2:12 PM 09/27/2020    2:01 PM  Fall Risk   Falls in the past year? 0 0 0 0 0  Number falls in past yr: 0 0 0 0 0  Injury with Fall? 0 0 0 0 0  Risk for fall due to :  No Fall Risks      Follow up Falls prevention discussed;Falls evaluation completed Falls evaluation completed;Falls prevention discussed Falls evaluation completed Falls evaluation completed Falls evaluation completed    MEDICARE RISK AT HOME: Medicare Risk at Home Any stairs in or around the home?: Yes If so, are there any without handrails?: Yes Home free of loose throw rugs in walkways, pet beds, electrical cords, etc?: Yes Adequate lighting in your home to reduce risk of falls?: Yes Life alert?: No Use of a cane, walker or w/c?: No Grab bars in the bathroom?: No Shower chair or bench in shower?: No Elevated toilet seat or a handicapped toilet?: No  TIMED UP AND GO:  Was the test performed?  No    Cognitive Function:        08/07/2023    3:12 PM 09/25/2021   10:27 AM 01/05/2019    2:58 PM  6CIT Screen  What Year? 0 points 0 points 0 points  What month? 0 points 0 points 0 points  What time? 0 points 3 points 0 points  Count back from 20 4 points 2 points 0 points  Months in reverse 4 points 2 points 0 points  Repeat phrase 4 points 4 points 2 points  Total Score 12 points 11 points 2 points    Immunizations Immunization History  Administered Date(s) Administered   Influenza,inj,Quad PF,6+ Mos 08/31/2018, 08/13/2019, 09/27/2020, 10/02/2021, 12/12/2022   PFIZER(Purple Top)SARS-COV-2 Vaccination 06/28/2020, 07/26/2020   Tdap 06/30/2018    TDAP status: Up to date  Flu Vaccine status: Up to date  Pneumococcal vaccine status: Declined,  Education has been provided regarding the importance of this vaccine  but patient still declined. Advised may receive this vaccine at local pharmacy or Health Dept. Aware to provide a copy of the vaccination record if obtained from local pharmacy or Health Dept. Verbalized acceptance and understanding.   Covid-19 vaccine status: Completed vaccines  Qualifies for Shingles Vaccine? Yes   Zostavax completed No   Shingrix Completed?: No.    Education has been provided regarding the importance of this vaccine. Patient has been advised to call insurance company to determine out of pocket expense if they have not yet received this vaccine. Advised may also receive vaccine at local pharmacy or Health Dept. Verbalized acceptance and understanding.  Screening Tests Health Maintenance  Topic Date Due   Hepatitis C Screening  Never done   Colonoscopy  Never done   INFLUENZA VACCINE  06/19/2023   COVID-19 Vaccine (3 - 2023-24 season) 07/20/2023   Medicare Annual Wellness (AWV)  08/06/2024   DTaP/Tdap/Td (2 - Td or Tdap) 06/30/2028   HIV Screening  Completed   HPV VACCINES  Aged Out    Health Maintenance  Health Maintenance Due  Topic Date Due   Hepatitis C Screening  Never done   Colonoscopy  Never done   INFLUENZA VACCINE  06/19/2023   COVID-19 Vaccine (3 - 2023-24 season) 07/20/2023    Declined referral for colonscopy  Lung Cancer Screening: (Low Dose CT Chest recommended if Age 12-80 years, 20 pack-year currently smoking OR have quit w/in 15years.) does not qualify.    Additional Screening:  Hepatitis C Screening: does qualify; Completed no  Vision Screening: Recommended annual ophthalmology exams for early detection of glaucoma and other disorders of the eye. Is the patient up to date with their annual eye exam?  No  Who is the provider or what is the name of the office in which the patient attends annual  eye exams? No one If pt is not established with a provider, would they like to be referred to a provider to establish care? No .   Dental  Screening: Recommended annual dental exams for proper oral hygiene   Community Resource Referral / Chronic Care Management: CRR required this visit?  No   CCM required this visit?  No     Plan:     I have personally reviewed and noted the following in the patient's chart:   Medical and social history Use of alcohol, tobacco or illicit drugs  Current medications and supplements including opioid prescriptions. Patient is not currently taking opioid prescriptions. Functional ability and status Nutritional status Physical activity Advanced directives List of other physicians Hospitalizations, surgeries, and ER visits in previous 12 months Vitals Screenings to include cognitive, depression, and falls Referrals and appointments  In addition, I have reviewed and discussed with patient certain preventive protocols, quality metrics, and best practice recommendations. A written personalized care plan for preventive services as well as general preventive health recommendations were provided to patient.     Hal Hope, LPN   1/61/0960   After Visit Summary: (MyChart) Due to this being a telephonic visit, the after visit summary with patients personalized plan was offered to patient via MyChart   Nurse Notes: none

## 2023-08-07 NOTE — Patient Instructions (Addendum)
Mr. Aulet , Thank you for taking time to come for your Medicare Wellness Visit. I appreciate your ongoing commitment to your health goals. Please review the following plan we discussed and let me know if I can assist you in the future.   Referrals/Orders/Follow-Ups/Clinician Recommendations: none  This is a list of the screening recommended for you and due dates:  Health Maintenance  Topic Date Due   Hepatitis C Screening  Never done   Colon Cancer Screening  Never done   Flu Shot  06/19/2023   COVID-19 Vaccine (3 - 2023-24 season) 07/20/2023   Medicare Annual Wellness Visit  08/06/2024   DTaP/Tdap/Td vaccine (2 - Td or Tdap) 06/30/2028   HIV Screening  Completed   HPV Vaccine  Aged Out    Advanced directives: (ACP Link)Information on Advanced Care Planning can be found at [facility] Advance Health Care Directives Advance Health Care Directives ([email or web address]   Next Medicare Annual Wellness Visit scheduled for next year: Yes   08/12/24 @ 1:00 pm by video

## 2023-11-17 ENCOUNTER — Other Ambulatory Visit: Payer: Self-pay | Admitting: Family Medicine

## 2023-11-17 DIAGNOSIS — I10 Essential (primary) hypertension: Secondary | ICD-10-CM

## 2023-11-20 NOTE — Telephone Encounter (Signed)
 Requested Prescriptions  Pending Prescriptions Disp Refills   amLODipine  (NORVASC ) 5 MG tablet [Pharmacy Med Name: amLODIPine  Besylate 5 MG Oral Tablet] 90 tablet 0    Sig: Take 1 tablet by mouth once daily     Cardiovascular: Calcium  Channel Blockers 2 Passed - 11/20/2023  5:31 PM      Passed - Last BP in normal range    BP Readings from Last 1 Encounters:  06/13/23 132/82         Passed - Last Heart Rate in normal range    Pulse Readings from Last 1 Encounters:  06/13/23 81         Passed - Valid encounter within last 6 months    Recent Outpatient Visits           5 months ago Pre-diabetes   [place] [facility] [place] Marsa PARAS, DO   11 months ago Annual physical exam   [place] [facility] [place], Marsa PARAS, DO   2 years ago Annual physical exam   [place] [facility] [place] Marsa PARAS, DO   2 years ago Encounter for [employer] annual wellness exam   [place] [facility] [place] Marsa PARAS, DO   2 years ago Chronic right shoulder pain   [place] [facility] [place], Marsa PARAS, DO       Future Appointments             In 3 weeks [place], Marsa PARAS, DO  [place], [facility]

## 2023-12-08 ENCOUNTER — Other Ambulatory Visit: Payer: Self-pay | Admitting: Family Medicine

## 2023-12-08 DIAGNOSIS — E7800 Pure hypercholesterolemia, unspecified: Secondary | ICD-10-CM

## 2023-12-08 NOTE — Telephone Encounter (Signed)
Requested medication (s) are due for refill today: yes  Requested medication (s) are on the active medication list: yes  Last refill:  12/12/22 #90 3 RF  Future visit scheduled: yes  Notes to clinic:  Overdue labs - should have enough to last until appt   Requested Prescriptions  Pending Prescriptions Disp Refills   rosuvastatin (CRESTOR) 20 MG tablet [Pharmacy Med Name: Rosuvastatin Calcium 20 MG Oral Tablet] 90 tablet 0    Sig: TAKE 1 TABLET BY MOUTH AT BEDTIME     Cardiovascular:  Antilipid - Statins 2 Failed - 12/08/2023  3:17 PM      Failed - Cr in normal range and within 360 days    Creat  Date Value Ref Range Status  12/06/2022 1.07 0.60 - 1.29 mg/dL Final         Failed - Lipid Panel in normal range within the last 12 months    Cholesterol  Date Value Ref Range Status  12/06/2022 168 <200 mg/dL Final   LDL Cholesterol (Calc)  Date Value Ref Range Status  12/06/2022 86 mg/dL (calc) Final    Comment:    Reference range: <100 . Desirable range <100 mg/dL for primary prevention;   <70 mg/dL for patients with CHD or diabetic patients  with > or = 2 CHD risk factors. Marland Kitchen LDL-C is now calculated using the Martin-Hopkins  calculation, which is a validated novel method providing  better accuracy than the Friedewald equation in the  estimation of LDL-C.  Horald Pollen et al. Lenox Ahr. 1610;960(45): 2061-2068  (http://education.[employer].com/faq/FAQ164)    HDL  Date Value Ref Range Status  12/06/2022 63 > OR = 40 mg/dL Final   Triglycerides  Date Value Ref Range Status  12/06/2022 93 <150 mg/dL Final         Passed - Patient is not pregnant      Passed - Valid encounter within last 12 months    Recent Outpatient Visits           5 months ago Pre-diabetes   [place] [facility] Smitty Cords, DO   12 months ago Annual physical exam   [place] [facility] Smitty Cords, DO   2 years ago Annual  physical exam   [place] [facility] Smitty Cords, DO   2 years ago Encounter for [employer] annual wellness exam   [place] [facility] Smitty Cords, DO   2 years ago Chronic right shoulder pain   [place] [facility] Smitty Cords, DO       Future Appointments             In 1 week Althea Charon, Netta Neat, DO [place] [facility], [facility]

## 2023-12-10 ENCOUNTER — Other Ambulatory Visit: Payer: 59

## 2023-12-10 DIAGNOSIS — E7800 Pure hypercholesterolemia, unspecified: Secondary | ICD-10-CM

## 2023-12-10 DIAGNOSIS — Z125 Encounter for screening for malignant neoplasm of prostate: Secondary | ICD-10-CM

## 2023-12-10 DIAGNOSIS — R7303 Prediabetes: Secondary | ICD-10-CM

## 2023-12-10 DIAGNOSIS — Z0000 Encounter for general adult medical examination without abnormal findings: Secondary | ICD-10-CM

## 2023-12-10 DIAGNOSIS — I10 Essential (primary) hypertension: Secondary | ICD-10-CM

## 2023-12-17 ENCOUNTER — Ambulatory Visit (INDEPENDENT_AMBULATORY_CARE_PROVIDER_SITE_OTHER): Payer: 59 | Admitting: Family Medicine

## 2023-12-17 ENCOUNTER — Encounter: Payer: 59 | Admitting: Family Medicine

## 2023-12-17 ENCOUNTER — Encounter: Payer: Self-pay | Admitting: Family Medicine

## 2023-12-17 VITALS — BP 120/82 | HR 76 | Ht 64.0 in | Wt 145.0 lb

## 2023-12-17 DIAGNOSIS — Z23 Encounter for immunization: Secondary | ICD-10-CM

## 2023-12-17 DIAGNOSIS — M7541 Impingement syndrome of right shoulder: Secondary | ICD-10-CM

## 2023-12-17 DIAGNOSIS — G8929 Other chronic pain: Secondary | ICD-10-CM

## 2023-12-17 DIAGNOSIS — I10 Essential (primary) hypertension: Secondary | ICD-10-CM

## 2023-12-17 DIAGNOSIS — M25511 Pain in right shoulder: Secondary | ICD-10-CM

## 2023-12-17 DIAGNOSIS — Z1211 Encounter for screening for malignant neoplasm of colon: Secondary | ICD-10-CM

## 2023-12-17 DIAGNOSIS — R7303 Prediabetes: Secondary | ICD-10-CM | POA: Diagnosis not present

## 2023-12-17 DIAGNOSIS — E7800 Pure hypercholesterolemia, unspecified: Secondary | ICD-10-CM | POA: Diagnosis not present

## 2023-12-17 DIAGNOSIS — F79 Unspecified intellectual disabilities: Secondary | ICD-10-CM

## 2023-12-17 DIAGNOSIS — Z125 Encounter for screening for malignant neoplasm of prostate: Secondary | ICD-10-CM

## 2023-12-17 DIAGNOSIS — Z0000 Encounter for general adult medical examination without abnormal findings: Secondary | ICD-10-CM

## 2023-12-17 LAB — POCT GLYCOSYLATED HEMOGLOBIN (HGB A1C): Hemoglobin A1C: 6 % — AB (ref 4.0–5.6)

## 2023-12-17 MED ORDER — ROSUVASTATIN CALCIUM 20 MG PO TABS: 20.0000 mg | ORAL_TABLET | Freq: Every day | ORAL | 3 refills | Status: DC

## 2023-12-17 MED ORDER — AMLODIPINE BESYLATE 5 MG PO TABS: 5.0000 mg | ORAL_TABLET | Freq: Every day | ORAL | 3 refills | Status: DC

## 2023-12-17 MED ORDER — BACLOFEN 10 MG PO TABS: 10.0000 mg | ORAL_TABLET | Freq: Three times a day (TID) | ORAL | 3 refills | Status: DC | PRN

## 2023-12-17 NOTE — Patient Instructions (Addendum)
Thank you for coming to the office today.  Lab today  Recent Labs    06/13/23 1454 12/17/23 1522  HGBA1C 5.9* 6.0*    ------------------------------------  Colon Cancer Screening: Ordered the Cologuard (home kit) test for colon cancer screening. Stay tuned for further updates.  It will be shipped to you directly. If not received in 2-4 weeks, call us or the company.   If you send it back and no results are received in 2-4 weeks, call us or the company as well!   Colon Cancer Screening: - For all adults age 50+ routine colon cancer screening is highly recommended.     - Recent guidelines from American Cancer Society recommend starting age of 44 - Early detection of colon cancer is important, because often there are no warning signs or symptoms, also if found early usually it can be cured. Late stage is hard to treat.   - If Cologuard is NEGATIVE, then it is good for 3 years before next due - If Cologuard is POSITIVE, then it is strongly advised to get a Colonoscopy, which allows the GI doctor to locate the source of the cancer or polyp (even very early stage) and treat it by removing it. ------------------------- Follow instructions to collect sample, you may call the company for any help or questions, 24/7 telephone support at [phone].  ------------------ Coronary Calcium Score Cardiac CT Scan. This is a screening test for patients aged 50-50+ with cardiovascular risk factors or who are healthy but would be interested in Cardiovascular Screening for heart disease. Even if there is a family history of heart disease, this imaging can be useful. Typically it can be done every 5+ years or at a different timeline we agree on  The scan will look at the chest and mainly focus on the heart and identify early signs of calcium build up or blockages within the heart arteries. It is not 100% accurate for identifying blockages or heart disease, but it is useful to help [place] predict who  may have some early changes or be at risk in the future for a heart attack or cardiovascular problem.  The results are reviewed by a Cardiologist and they will document the results. It should become available on MyChart. Typically the results are divided into percentiles based on other patients of the same demographic and age. So it will compare your risk to others similar to you. If you have a higher score >99 or higher percentile >75%tile, it is recommended to consider Statin cholesterol therapy and or referral to Cardiologist. I will try to help explain your results and if we have questions we can contact the Cardiologist.  You will be contacted for scheduling. Usually it is done at any imaging facility through [facility], [facility] or [facility].  The cost is $99 flat fee total and it does not go through insurance, so no authorization is required.   Please schedule a Follow-up Appointment to: Return for 6 month PreDM A1c.  If you have any other questions or concerns, please feel free to call the office or send a message through MyChart. You may also schedule an earlier appointment if necessary.  Additionally, you may be receiving a survey about your experience at our office within a few days to 1 week by e-mail or mail. We value your feedback.  Saralyn Pilar, DO [facility], [place]

## 2023-12-17 NOTE — Progress Notes (Signed)
Subjective:    Patient ID: Peter Mcmahon, male    DOB: June 01, 1976, 48 y.o.   MRN: [id number]  Peter Mcmahon is a 48 y.o. male presenting on 12/17/2023 for Annual Exam   HPI  Discussed the use of AI scribe software for clinical note transcription with the patient, who gave verbal consent to proceed.  History of Present Illness     Pre-Diabetes A1c now 6.0, prior 5.9 to 6.2 Improving exercise, more water intake, and salads and avoiding sugar and carbs.   Not checking CBG Lifestyle: - Family history of Diabetes - Diet (Still trying to improve low carb diet now less sweets, caregiver helps reduce his sweets, - Exercise (activity, some walking) Denies hypoglycemia, polyuria, visual changes, numbness or tingling.   CHRONIC HTN Home BP controlled Current Meds - Amlodipine 5mg  daily Fam history mother and grandmother on mom side HTN - sister Admits headaches Denies CP, dyspnea, edema, dizziness / lightheadedness   HYPERLIPIDEMIA: - Reports no concerns. Last lipid panel 11/2022, controlled on statin LDL 86 Due for repeat lab - Currently taking Rosuvastatin 20mg , tolerating well without side effects or myalgias   History of Back Pain    Chronic R Shoulder Pain Followed by Orthoped[facility] - has seen Dr Landry Mellow, had US guided inj 07/2021 some benefit but not resolved. 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    Health Maintenance   Order Cologuard. Note they received ColoFit from insurance, but discussed difference and better accuracy of screening on Cologuard. Flu Shot today     08/07/2023    3:09 PM 09/25/2021   10:43 AM 07/24/2021    8:50 AM  Depression screen PHQ 2/9  Decreased Interest 0 0 0  Down, Depressed, Hopeless 0 0 0  PHQ - 2 Score 0 0 0  Altered sleeping 0  0  Tired, decreased energy 0  0  Change in appetite 0  0  Feeling bad or failure about yourself  0  0  Trouble concentrating 0  0  Moving slowly  or fidgety/restless 0  0  Suicidal thoughts 0  0  PHQ-9 Score 0  0  Difficult doing work/chores Not difficult at all  Not difficult at all       07/24/2021    8:50 AM 03/27/2021    2:12 PM  GAD 7 : Generalized Anxiety Score  Nervous, Anxious, on Edge 0 0  Control/stop worrying 0 0  Worry too much - different things 0 0  Trouble relaxing 0 0  Restless 0 0  Easily annoyed or irritable 0 0  Afraid - awful might happen 0 0  Total GAD 7 Score 0 0  Anxiety Difficulty Not difficult at all Not difficult at all     Past Medical History:  Diagnosis Date   Hypertension    History reviewed. No pertinent surgical history. Social History   Socioeconomic History   Marital status: Single    Spouse name: Not on file   Number of children: Not on file   Years of education: Not on file   Highest education level: High school graduate  Occupational History   Occupation: disability   Tobacco Use   Smoking status: Never   Smokeless tobacco: Never  Vaping Use   Vaping status: Never Used  Substance and Sexual Activity   Alcohol use: Never   Drug use: Never   Sexual activity: Not Currently  Other Topics Concern  Not on file  Social History Narrative   Attends church and sees friends or family often    Social Drivers of Corporate investment banker Strain: Low Risk  (08/07/2023)   Overall Financial Resource Strain (CARDIA)    Difficulty of Paying Living Expenses: Not hard at all  Food Insecurity: No Food Insecurity (08/07/2023)   Hunger Vital Sign    Worried About Running Out of Food in the Last Year: Never true    Ran Out of Food in the Last Year: Never true  Transportation Needs: No Transportation Needs (08/07/2023)   PRAPARE - Administrator, Civil Service (Medical): No    Lack of Transportation (Non-Medical): No  Physical Activity: Insufficiently Active (08/07/2023)   Exercise Vital Sign    Days of Exercise per Week: 3 days    Minutes of Exercise per Session: 30 min   Stress: No Stress Concern Present (08/07/2023)   [employer] of Occupational Health - Occupational Stress Questionnaire    Feeling of Stress : Not at all  Social Connections: Socially Isolated (08/07/2023)   Social Connection and Isolation Panel [NHANES]    Frequency of Communication with Friends and Family: Twice a week    Frequency of Social Gatherings with Friends and Family: Once a week    Attends Religious Services: Never    Database administrator or Organizations: No    Attends Banker Meetings: Never    Marital Status: Never married  Intimate Partner Violence: Not At Risk (08/07/2023)   Humiliation, Afraid, Rape, and Kick questionnaire    Fear of Current or Ex-Partner: No    Emotionally Abused: No    Physically Abused: No    Sexually Abused: No   Family History  Problem Relation Age of Onset   Diabetes Father    Colon cancer Father    Hypertension Mother    Diabetes Sister    Diabetes Brother    Diabetes Paternal Uncle    Diabetes Paternal Grandfather    Prostate cancer Neg Hx    Current Outpatient Medications on File Prior to Visit  Medication Sig   meloxicam (MOBIC) 15 MG tablet Take 1 tablet (15 mg total) by mouth daily as needed for pain.   No current facility-administered medications on file prior to visit.    Review of Systems  Constitutional:  Negative for activity change, appetite change, chills, diaphoresis, fatigue and fever.  HENT:  Negative for congestion and hearing loss.   Eyes:  Negative for visual disturbance.  Respiratory:  Negative for cough, chest tightness, shortness of breath and wheezing.   Cardiovascular:  Negative for chest pain, palpitations and leg swelling.  Gastrointestinal:  Negative for abdominal pain, constipation, diarrhea, nausea and vomiting.  Genitourinary:  Negative for dysuria, frequency and hematuria.  Musculoskeletal:  Negative for arthralgias and neck pain.  Skin:  Negative for rash.  Neurological:   Negative for dizziness, weakness, light-headedness, numbness and headaches.  Hematological:  Negative for adenopathy.  Psychiatric/Behavioral:  Negative for behavioral problems, dysphoric mood and sleep disturbance.    Per HPI unless specifically indicated above     Objective:    BP 120/82   Pulse 76   Ht 5\' 4"  (1.626 m)   Wt 145 lb (65.8 kg)   SpO2 92%   BMI 24.89 kg/m   Wt Readings from Last 3 Encounters:  12/17/23 145 lb (65.8 kg)  06/13/23 146 lb (66.2 kg)  12/12/22 139 lb 3.2 oz (63.1 kg)  Physical Exam Vitals and nursing note reviewed.  Constitutional:      General: He is not in acute distress.    Appearance: He is well-developed. He is not diaphoretic.     Comments: Well-appearing, comfortable, cooperative  HENT:     Head: Normocephalic and atraumatic.  Eyes:     General:        Right eye: No discharge.        Left eye: No discharge.     Conjunctiva/sclera: Conjunctivae normal.     Pupils: Pupils are equal, round, and reactive to light.  Neck:     Thyroid: No thyromegaly.     Vascular: No carotid bruit.  Cardiovascular:     Rate and Rhythm: Normal rate and regular rhythm.     Pulses: Normal pulses.     Heart sounds: Normal heart sounds. No murmur heard. Pulmonary:     Effort: Pulmonary effort is normal. No respiratory distress.     Breath sounds: Normal breath sounds. No wheezing or rales.  Abdominal:     General: Bowel sounds are normal. There is no distension.     Palpations: Abdomen is soft. There is no mass.     Tenderness: There is no abdominal tenderness.  Musculoskeletal:        General: No tenderness. Normal range of motion.     Cervical back: Normal range of motion and neck supple.     Right lower leg: No edema.     Left lower leg: No edema.     Comments: Upper / Lower Extremities: - Normal muscle tone, strength bilateral upper extremities 5/5, lower extremities 5/5  Lymphadenopathy:     Cervical: No cervical adenopathy.  Skin:    General:  Skin is warm and dry.     Findings: No erythema or rash.  Neurological:     Mental Status: He is alert and oriented to person, place, and time.     Comments: Distal sensation intact to light touch all extremities  Psychiatric:        Mood and Affect: Mood normal.        Behavior: Behavior normal.        Thought Content: Thought content normal.     Comments: Well groomed, good eye contact, normal speech and thoughts     Results for orders placed or performed in visit on 12/17/23  POCT HgB A1C   Collection Time: 12/17/23  3:22 PM  Result Value Ref Range   Hemoglobin A1C 6.0 (A) 4.0 - 5.6 %   HbA1c POC (<> result, manual entry)     HbA1c, POC (prediabetic range)     HbA1c, POC (controlled diabetic range)    CBC with Differential/Platelet   Collection Time: 12/17/23  3:35 PM  Result Value Ref Range   WBC 4.6 3.8 - 10.8 Thousand/uL   RBC 4.41 4.20 - 5.80 Million/uL   Hemoglobin 13.8 13.2 - 17.1 g/dL   HCT 16.1 09.6 - 04.5 %   MCV 93.4 80.0 - 100.0 fL   MCH 31.3 27.0 - 33.0 pg   MCHC 33.5 32.0 - 36.0 g/dL   RDW 40.9 81.1 - 91.4 %   Platelets 168 140 - 400 Thousand/uL   MPV 12.1 7.5 - 12.5 fL   Neutro Abs 2,498 1,500 - 7,800 cells/uL   Absolute Lymphocytes 1,509 850 - 3,900 cells/uL   Absolute Monocytes 524 200 - 950 cells/uL   Eosinophils Absolute 60 15 - 500 cells/uL   Basophils Absolute 9 0 -  200 cells/uL   Neutrophils Relative % 54.3 %   Total Lymphocyte 32.8 %   Monocytes Relative 11.4 %   Eosinophils Relative 1.3 %   Basophils Relative 0.2 %      Assessment & Plan:   Problem List Items Addressed This Visit     Chronic right shoulder pain   Relevant Medications   baclofen (LIORESAL) 10 MG tablet   Essential hypertension   Relevant Medications   amLODipine (NORVASC) 5 MG tablet   rosuvastatin (CRESTOR) 20 MG tablet   Other Relevant Orders   COMPLETE METABOLIC PANEL WITH GFR   CBC with Differential/Platelet (Completed)   Hypercholesteremia   Relevant  Medications   amLODipine (NORVASC) 5 MG tablet   rosuvastatin (CRESTOR) 20 MG tablet   Other Relevant Orders   Lipid panel   COMPLETE METABOLIC PANEL WITH GFR   TSH   Intellectual disability   Pre-diabetes   Relevant Orders   POCT HgB A1C (Completed)   Other Visit Diagnoses       Annual physical exam    -  Primary   Relevant Orders   Lipid panel   COMPLETE METABOLIC PANEL WITH GFR   CBC with Differential/Platelet (Completed)   PSA   TSH     Screening PSA (prostate specific antigen)       Relevant Orders   PSA     Needs flu shot         Flu vaccine need       Relevant Orders   Flu vaccine trivalent PF, 6mos and older(Flulaval,Afluria,Fluarix,Fluzone) (Completed)     Screen for colon cancer       Relevant Orders   Cologuard     Rotator cuff impingement syndrome, right       Relevant Medications   baclofen (LIORESAL) 10 MG tablet        Updated Health Maintenance information Fasting labs today Encouraged improvement to lifestyle with diet and exercise Goal of weight loss   Prediabetes Stable A1C at 6.0, similar to 6 months ago. Discussed importance of diet control, specifically limiting sweets. -Continue current lifestyle modifications. -Check A1C in 6 months.  Hyperlipidemia -Check lipid panel today. Continue Rosuvastatin 20mg  daily  Hypertension Blood pressure well-controlled at 120/82. -Continue current medication. - Amlodipine 5mg  daily -Check comprehensive metabolic panel today.  Musculoskeletal pain Request for refill of muscle relaxant medication. -Refill Baclofen prescription.  General Health Maintenance -Administer influenza vaccine today. -Order Cologuard for colorectal cancer screening. -Discussed optional CT Coronary Calcium heart scan for early detection of arterial blockage; patient to consider. Not required, he is already on Statin. -Schedule follow-up appointment in 6 months.         Orders Placed This Encounter  Procedures   Flu  vaccine trivalent PF, 6mos and older(Flulaval,Afluria,Fluarix,Fluzone)   Lipid panel    Has the patient fasted?:   Yes   COMPLETE METABOLIC PANEL WITH GFR   CBC with Differential/Platelet   PSA   TSH   Cologuard   POCT HgB A1C    Meds ordered this encounter  Medications   amLODipine (NORVASC) 5 MG tablet    Sig: Take 1 tablet (5 mg total) by mouth daily.    Dispense:  90 tablet    Refill:  3    Add refills   baclofen (LIORESAL) 10 MG tablet    Sig: Take 1 tablet (10 mg total) by mouth 3 (three) times daily as needed for muscle spasms.    Dispense:  270 each  Refill:  3    Increase frequency and change to 90 day   rosuvastatin (CRESTOR) 20 MG tablet    Sig: Take 1 tablet (20 mg total) by mouth at bedtime.    Dispense:  90 tablet    Refill:  3    Add refills to future     Follow up plan: Return for 6 month PreDM A1c.  Saralyn Pilar, DO [facility] 12/17/2023, 3:26 PM

## 2023-12-18 LAB — CBC WITH DIFFERENTIAL/PLATELET
Absolute Lymphocytes: 1509 {cells}/uL (ref 850–3900)
Absolute Monocytes: 524 {cells}/uL (ref 200–950)
Basophils Absolute: 9 {cells}/uL (ref 0–200)
Basophils Relative: 0.2 %
Eosinophils Absolute: 60 {cells}/uL (ref 15–500)
Eosinophils Relative: 1.3 %
HCT: 41.2 % (ref 38.5–50.0)
Hemoglobin: 13.8 g/dL (ref 13.2–17.1)
MCH: 31.3 pg (ref 27.0–33.0)
MCHC: 33.5 g/dL (ref 32.0–36.0)
MCV: 93.4 fL (ref 80.0–100.0)
MPV: 12.1 fL (ref 7.5–12.5)
Monocytes Relative: 11.4 %
Neutro Abs: 2498 {cells}/uL (ref 1500–7800)
Neutrophils Relative %: 54.3 %
Platelets: 168 10*3/uL (ref 140–400)
RBC: 4.41 10*6/uL (ref 4.20–5.80)
RDW: 13.5 % (ref 11.0–15.0)
Total Lymphocyte: 32.8 %
WBC: 4.6 10*3/uL (ref 3.8–10.8)

## 2023-12-18 LAB — TSH: TSH: 1.22 m[IU]/L (ref 0.40–4.50)

## 2023-12-18 LAB — COMPLETE METABOLIC PANEL WITH GFR
AG Ratio: 1.7 (calc) (ref 1.0–2.5)
ALT: 36 U/L (ref 9–46)
AST: 25 U/L (ref 10–40)
Albumin: 4.9 g/dL (ref 3.6–5.1)
Alkaline phosphatase (APISO): 73 U/L (ref 36–130)
BUN: 13 mg/dL (ref 7–25)
CO2: 31 mmol/L (ref 20–32)
Calcium: 10.6 mg/dL — ABNORMAL HIGH (ref 8.6–10.3)
Chloride: 103 mmol/L (ref 98–110)
Creat: 1.05 mg/dL (ref 0.60–1.29)
Globulin: 2.9 g/dL (ref 1.9–3.7)
Glucose, Bld: 92 mg/dL (ref 65–99)
Potassium: 4.2 mmol/L (ref 3.5–5.3)
Sodium: 141 mmol/L (ref 135–146)
Total Bilirubin: 1 mg/dL (ref 0.2–1.2)
Total Protein: 7.8 g/dL (ref 6.1–8.1)
eGFR: 87 mL/min/{1.73_m2} (ref >=60)

## 2023-12-18 LAB — LIPID PANEL
Cholesterol: 151 mg/dL (ref <200)
HDL: 59 mg/dL (ref >=40)
LDL Cholesterol (Calc): 78 mg/dL
Non-HDL Cholesterol (Calc): 92 mg/dL (ref <130)
Total CHOL/HDL Ratio: 2.6 (calc) (ref <5.0)
Triglycerides: 65 mg/dL (ref <150)

## 2023-12-18 LAB — PSA: PSA: 1.25 ng/mL (ref <=4.00)

## 2024-01-08 LAB — COLOGUARD: COLOGUARD: NEGATIVE

## 2024-06-17 ENCOUNTER — Ambulatory Visit: Payer: 59 | Admitting: Family Medicine

## 2024-07-09 ENCOUNTER — Ambulatory Visit: Admitting: Family Medicine

## 2024-07-16 ENCOUNTER — Ambulatory Visit: Admitting: Family Medicine

## 2024-07-22 ENCOUNTER — Ambulatory Visit: Admitting: Family Medicine

## 2024-08-12 ENCOUNTER — Telehealth: Payer: Self-pay

## 2024-08-12 ENCOUNTER — Ambulatory Visit: Payer: Self-pay

## 2024-08-12 NOTE — Telephone Encounter (Signed)
 FYI Only or Action Required?: FYI only for provider.  Patient was last seen in primary care on 12/17/2023 by Edman Marsa PARAS, DO.  Called Nurse Triage reporting Hypertension BP 178/85.  Symptoms began several days ago.  Interventions attempted: bough a wrist BP cuff- advised fiance to bring to appt  Symptoms are: gradually worsening.  Triage Disposition: See PCP When Office is Open (Within 3 Days)  Patient/caregiver understands and will follow disposition?:         Copied from CRM #[id number]. Topic: Clinical - Red Word Triage >> Aug 12, 2024  1:55 PM Turkey B wrote: [facility] that prompted transfer to Nurse Triage: Patient's fiance called in with patient says bp high and has bd headaches at a 4 Reason for Disposition  Systolic BP >= 160 OR Diastolic >= 100  Answer Assessment - Initial Assessment Questions 1. BLOOD PRESSURE: What is your blood pressure? Did you take at least two measurements 5 minutes apart?     178/85 2. ONSET: When did you take your blood pressure?     Just before  3. HOW: How did you take your blood pressure? (e.g., automatic home BP monitor, visiting nurse)     Wrist monitor 4. HISTORY: Do you have a history of high blood pressure?     yes 5. MEDICINES: Are you taking any medicines for blood pressure? Have you missed any doses recently?     Yes  6. OTHER SYMPTOMS: Do you have any symptoms? (e.g., blurred vision, chest pain, difficulty breathing, headache, weakness)     Frequent headaches off and on  Protocols used: Blood Pressure - High-A-AH

## 2024-08-12 NOTE — Telephone Encounter (Signed)
 Pt asked for AWV to be done over the phone. Stated unable to video. Changed modality but kept date and time as previously scheduled.

## 2024-08-13 ENCOUNTER — Ambulatory Visit

## 2024-08-13 ENCOUNTER — Ambulatory Visit: Payer: 59

## 2024-08-13 DIAGNOSIS — Z0000 Encounter for general adult medical examination without abnormal findings: Secondary | ICD-10-CM

## 2024-08-13 NOTE — Patient Instructions (Addendum)
 Peter Mcmahon,  Thank you for taking the time for your Medicare Wellness Visit. I appreciate your continued commitment to your health goals. Please review the care plan we discussed, and feel free to reach out if I can assist you further.  Medicare recommends these wellness visits once per year to help you and your care team stay ahead of potential health issues. These visits are designed to focus on prevention, allowing your provider to concentrate on managing your acute and chronic conditions during your regular appointments.  Please note that Annual Wellness Visits do not include a physical exam. Some assessments may be limited, especially if the visit was conducted virtually. If needed, we may recommend a separate in-person follow-up with your provider.  Ongoing Care Seeing your primary care provider every 3 to 6 months helps us  monitor your health and provide consistent, personalized care.   Referrals If a referral was made during today's visit and you haven't received any updates within two weeks, please contact the referred provider directly to check on the status.  Recommended Screenings:  Health Maintenance  Topic Date Due   Hepatitis C Screening  Never done   Hepatitis B Vaccine (1 of 3 - 19+ 3-dose series) Never done   Colon Cancer Screening  Never done   Pneumococcal Vaccine for age over 63 (1 of 1 - PCV) Never done   Zoster (Shingles) Vaccine (1 of 2) Never done   Flu Shot  06/18/2024   COVID-19 Vaccine (3 - 2025-26 season) 07/19/2024   Medicare Annual Wellness Visit  08/13/2025   DTaP/Tdap/Td vaccine (2 - Td or Tdap) 06/30/2028   HIV Screening  Completed   HPV Vaccine  Aged Out   Meningitis B Vaccine  Aged Out       08/13/2024    2:13 PM  Advanced Directives  Does Patient Have a Medical Advance Directive? No  Would patient like information on creating a medical advance directive? No - Patient declined   Advance Care Planning is important because it: Ensures you  receive medical care that aligns with your values, goals, and preferences. Provides guidance to your family and loved ones, reducing the emotional burden of decision-making during critical moments.  Vision: Annual vision screenings are recommended for early detection of glaucoma, cataracts, and diabetic retinopathy. These exams can also reveal signs of chronic conditions such as diabetes and high blood pressure.  Dental: Annual dental screenings help detect early signs of oral cancer, gum disease, and other conditions linked to overall health, including heart disease and diabetes.  Please see the attached documents for additional preventive care recommendations.   NEXT AWV 08/26/25 @ 3:20 PM BY PHONE

## 2024-08-13 NOTE — Progress Notes (Signed)
 Subjective:   Peter Mcmahon is a 48 y.o. who presents for a Medicare Wellness preventive visit.  As a reminder, Annual Wellness Visits don't include a physical exam, and some assessments may be limited, especially if this visit is performed virtually. We may recommend an in-person follow-up visit with your provider if needed.  Visit Complete: Virtual I connected with  Peter Mcmahon on 08/13/24 by a audio enabled telemedicine application and verified that I am speaking with the correct person using two identifiers.  Patient Location: Home  Provider Location: Home Office  I discussed the limitations of evaluation and management by telemedicine. The patient expressed understanding and agreed to proceed.  Vital Signs: Because this visit was a virtual/telehealth visit, some criteria may be missing or patient reported. Any vitals not documented were not able to be obtained and vitals that have been documented are patient reported.  VideoDeclined- This patient declined Librarian, academic. Therefore the visit was completed with audio only.  Persons Participating in Visit: Patient assisted by fiance' Peter Mcmahon.  AWV Questionnaire: No: Patient Medicare AWV questionnaire was not completed prior to this visit.  Cardiac Risk Factors include: hypertension;dyslipidemia;male gender     Objective:    There were no vitals filed for this visit. There is no height or weight on file to calculate BMI.     08/13/2024    2:13 PM 08/07/2023    3:10 PM 09/25/2021   10:29 AM 01/11/2020   10:50 AM 04/14/2019    9:27 PM 01/05/2019    3:01 PM  Advanced Directives  Does Patient Have a Medical Advance Directive? No No No No No No   Would patient like information on creating a medical advance directive? No - Patient declined No - Patient declined No - Patient declined  No - Patient declined  Yes (MAU/Ambulatory/Procedural Areas - Information given)      Data saved with a previous  flowsheet row definition    Current Medications (verified) Outpatient Encounter Medications as of 08/13/2024  Medication Sig   amLODipine  (NORVASC ) 5 MG tablet Take 1 tablet (5 mg total) by mouth daily.   baclofen  (LIORESAL ) 10 MG tablet Take 1 tablet (10 mg total) by mouth 3 (three) times daily as needed for muscle spasms.   meloxicam  (MOBIC ) 15 MG tablet Take 1 tablet (15 mg total) by mouth daily as needed for pain.   rosuvastatin  (CRESTOR ) 20 MG tablet Take 1 tablet (20 mg total) by mouth at bedtime.   No facility-administered encounter medications on file as of 08/13/2024.    Allergies (verified) Patient has no known allergies.   History: Past Medical History:  Diagnosis Date   Hypertension    History reviewed. No pertinent surgical history. Family History  Problem Relation Age of Onset   Diabetes Father    Colon cancer Father    Hypertension Mother    Diabetes Sister    Diabetes Brother    Diabetes Paternal Uncle    Diabetes Paternal Grandfather    Prostate cancer Neg Hx    Social History   Socioeconomic History   Marital status: Single    Spouse name: Not on file   Number of children: Not on file   Years of education: Not on file   Highest education level: High school graduate  Occupational History   Occupation: disability   Tobacco Use   Smoking status: Never   Smokeless tobacco: Never  Vaping Use   Vaping status: Never Used  Substance and Sexual Activity  Alcohol use: Never   Drug use: Never   Sexual activity: Not Currently  Other Topics Concern   Not on file  Social History Narrative   Attends church and sees friends or family often    Social Drivers of Corporate investment banker Strain: Low Risk  (08/13/2024)   Overall Financial Resource Strain (CARDIA)    Difficulty of Paying Living Expenses: Not hard at all  Food Insecurity: No Food Insecurity (08/13/2024)   Hunger Vital Sign    Worried About Running Out of Food in the Last Year: Never true     Ran Out of Food in the Last Year: Never true  Transportation Needs: No Transportation Needs (08/13/2024)   PRAPARE - Administrator, Civil Service (Medical): No    Lack of Transportation (Non-Medical): No  Physical Activity: Sufficiently Active (08/13/2024)   Exercise Vital Sign    Days of Exercise per Week: 7 days    Minutes of Exercise per Session: 30 min  Stress: No Stress Concern Present (08/13/2024)   [employer] of Occupational Health - Occupational Stress Questionnaire    Feeling of Stress: Not at all  Social Connections: Moderately Integrated (08/13/2024)   Social Connection and Isolation Panel    Frequency of Communication with Friends and Family: Three times a week    Frequency of Social Gatherings with Friends and Family: Not on file    Attends Religious Services: More than 4 times per year    Active Member of [employer] or Organizations: No    Attends Engineer, structural: Never    Marital Status: Living with partner    Tobacco Counseling Counseling given: Not Answered    Clinical Intake:  Pre-visit preparation completed: Yes  Pain : No/denies pain     BMI - recorded: 24.9 Nutritional Status: BMI of 19-24  Normal Nutritional Risks: None Diabetes: No  Lab Results  Component Value Date   HGBA1C 6.0 (A) 12/17/2023   HGBA1C 5.9 (A) 06/13/2023   HGBA1C 6.2 (H) 12/06/2022     How often do you need to have someone help you when you read instructions, pamphlets, or other written materials from your doctor or pharmacy?: 5 - Always  Interpreter Needed?: No  Information entered by :: [name], LPN   Activities of Daily Living     08/13/2024    2:14 PM  In your present state of health, do you have any difficulty performing the following activities:  Hearing? 1  Vision? 0  Difficulty concentrating or making decisions? 0  Walking or climbing stairs? 0  Dressing or bathing? 0  Doing errands, shopping? 1  Preparing Food and eating  ? N  Using the Toilet? N  In the past six months, have you accidently leaked urine? N  Do you have problems with loss of bowel control? N  Managing your Medications? Y  Managing your Finances? Y  Housekeeping or managing your Housekeeping? Y    Patient Care Team: Edman Marsa PARAS, DO as PCP - General (Family Medicine)  I have updated your Care Teams any recent Medical Services you may have received from other providers in the past year.     Assessment:   This is a routine wellness examination for [place].  Hearing/Vision screen Hearing Screening - Comments:: WEARS AIDS, BOTH EARS Vision Screening - Comments:: NO GLASSES   Goals Addressed             This Visit's Progress    DIET - INCREASE  WATER INTAKE         Depression Screen     08/13/2024    2:12 PM 08/07/2023    3:09 PM 09/25/2021   10:43 AM 07/24/2021    8:50 AM 03/27/2021    2:11 PM 09/27/2020    2:01 PM 02/14/2020   10:49 AM  PHQ 2/9 Scores  PHQ - 2 Score 0 0 0 0 0 0 0  PHQ- 9 Score 0 0  0 0      Fall Risk     08/13/2024    2:14 PM 08/07/2023    3:11 PM 09/25/2021   10:29 AM 07/24/2021    8:49 AM 03/27/2021    2:12 PM  Fall Risk   Falls in the past year? 0 0 0 0 0  Number falls in past yr: 0 0 0 0 0  Injury with Fall? 0 0 0 0 0  Risk for fall due to : No Fall Risks No Fall Risks     Follow up Falls evaluation completed;Falls prevention discussed Falls prevention discussed;Falls evaluation completed Falls evaluation completed;Falls prevention discussed  Falls evaluation completed  Falls evaluation completed      Data saved with a previous flowsheet row definition    MEDICARE RISK AT HOME:  Medicare Risk at Home Any stairs in or around the home?: No If so, are there any without handrails?: No Home free of loose throw rugs in walkways, pet beds, electrical cords, etc?: Yes Adequate lighting in your home to reduce risk of falls?: Yes Life alert?: No Use of a cane, walker or w/c?: No Grab bars in  the bathroom?: Yes Shower chair or bench in shower?: No Elevated toilet seat or a handicapped toilet?: No  TIMED UP AND GO:  Was the test performed?  No  Cognitive Function: Impaired: Patient has current diagnosis of cognitive impairment.        08/13/2024    2:16 PM 08/07/2023    3:12 PM 09/25/2021   10:27 AM 01/05/2019    2:58 PM  6CIT Screen  What Year? 0 points 0 points 0 points 0 points  What month? 0 points 0 points 0 points 0 points  What time? 0 points 0 points 3 points 0 points  Count back from 20 4 points 4 points 2 points 0 points  Months in reverse 4 points 4 points 2 points 0 points  Repeat phrase 2 points 4 points 4 points 2 points  Total Score 10 points 12 points 11 points 2 points    Immunizations Immunization History  Administered Date(s) Administered   Influenza, Seasonal, Injecte, Preservative Fre 12/17/2023   Influenza,inj,Quad PF,6+ Mos 08/31/2018, 08/13/2019, 09/27/2020, 10/02/2021, 12/12/2022   PFIZER(Purple Top)SARS-COV-2 Vaccination 06/28/2020, 07/26/2020   Tdap 06/30/2018    Screening Tests Health Maintenance  Topic Date Due   Hepatitis C Screening  Never done   Hepatitis B Vaccines 19-59 Average Risk (1 of 3 - 19+ 3-dose series) Never done   Colonoscopy  Never done   Pneumococcal Vaccine: 50+ Years (1 of 1 - PCV) Never done   Zoster Vaccines- Shingrix (1 of 2) Never done   Influenza Vaccine  06/18/2024   COVID-19 Vaccine (3 - 2025-26 season) 07/19/2024   Medicare Annual Wellness (AWV)  08/13/2025   DTaP/Tdap/Td (2 - Td or Tdap) 06/30/2028   HIV Screening  Completed   HPV VACCINES  Aged Out   Meningococcal B Vaccine  Aged Out    Health Maintenance Items Addressed: NEEDS TDAP, SHINGRIX,  FLU- UP TO DATE ON COLOGUARD  Additional Screening:  Vision Screening: Recommended annual ophthalmology exams for early detection of glaucoma and other disorders of the eye. Is the patient up to date with their annual eye exam?  No  Who is the provider  or what is the name of the office in which the patient attends annual eye exams? NO ONE- DOESN'T WANT REFERRAL  Dental Screening: Recommended annual dental exams for proper oral hygiene  Community Resource Referral / Chronic Care Management: CRR required this visit?  No   CCM required this visit?  No   Plan:    I have personally reviewed and noted the following in the patient's chart:   Medical and social history Use of alcohol, tobacco or illicit drugs  Current medications and supplements including opioid prescriptions. Patient is not currently taking opioid prescriptions. Functional ability and status Nutritional status Physical activity Advanced directives List of other physicians Hospitalizations, surgeries, and ER visits in previous 12 months Vitals Screenings to include cognitive, depression, and falls Referrals and appointments  In addition, I have reviewed and discussed with patient certain preventive protocols, quality metrics, and best practice recommendations. A written personalized care plan for preventive services as well as general preventive health recommendations were provided to patient.   Jhonnie GORMAN Das, LPN   [id number]   After Visit Summary: (MyChart) Due to this being a telephonic visit, the after visit summary with patients personalized plan was offered to patient via MyChart   Notes: Nothing significant to report at this time.

## 2024-08-16 ENCOUNTER — Other Ambulatory Visit: Payer: Self-pay | Admitting: Family Medicine

## 2024-08-16 ENCOUNTER — Ambulatory Visit (INDEPENDENT_AMBULATORY_CARE_PROVIDER_SITE_OTHER): Admitting: Family Medicine

## 2024-08-16 ENCOUNTER — Encounter: Payer: Self-pay | Admitting: Family Medicine

## 2024-08-16 VITALS — BP 130/80 | HR 72 | Ht 64.0 in | Wt 142.0 lb

## 2024-08-16 DIAGNOSIS — Z23 Encounter for immunization: Secondary | ICD-10-CM | POA: Diagnosis not present

## 2024-08-16 DIAGNOSIS — G43909 Migraine, unspecified, not intractable, without status migrainosus: Secondary | ICD-10-CM | POA: Diagnosis not present

## 2024-08-16 DIAGNOSIS — I10 Essential (primary) hypertension: Secondary | ICD-10-CM | POA: Diagnosis not present

## 2024-08-16 MED ORDER — RIZATRIPTAN BENZOATE 10 MG PO TBDP: 10.0000 mg | ORAL_TABLET | ORAL | 2 refills | Status: DC | PRN

## 2024-08-16 NOTE — Patient Instructions (Addendum)
 Thank you for coming to the office today.  BP readings improved with the cuff but still 130-140 range  Try switching back on the cuff.  We can adjust BP med in future  Try the Rizatriptan dissolving tab for migraine headache, can repeat within 2 hours if need if not resolved  Okay to take other anti inflammatory headache medicine if needed  Please schedule a Follow-up Appointment to: Return in about 4 months (around 12/16/2024) for 4 month fasting lab > 1 week later Annual Physical.  If you have any other questions or concerns, please feel free to call the office or send a message through MyChart. You may also schedule an earlier appointment if necessary.  Additionally, you may be receiving a survey about your experience at our office within a few days to 1 week by e-mail or mail. We value your feedback.  Marsa Officer, DO [facility], [place]

## 2024-08-16 NOTE — Progress Notes (Signed)
 Subjective:    Patient ID: Peter Mcmahon, male    DOB: 10-Oct-1976, 48 y.o.   MRN: [id number]  Peter Mcmahon is a 48 y.o. male presenting on 08/16/2024 for Hypertension (BP has been running high at home - )  Patient presents for a same day appointment.   HPI  Discussed the use of AI scribe software for clinical note transcription with the patient, who gave verbal consent to proceed.  History of Present Illness   Peter Mcmahon is a 48 year old male with hypertension who presents with concerns about blood pressure readings and headaches. He is accompanied by a family member who assists with his care.  Hypertension Elevated blood pressure lately - Blood pressure readings at home have been elevated, with some as high as 201 mmHg systolic on wrist cuff, question accuracy - During a recent house call by [employer], initial blood pressure was recorded at 212 mmHg systolic. - Currently using a new wrist blood pressure monitor, but questions the accuracy of the device. - Currently taking antihypertensive medication - Amlodipine  5mg  daily - Repeat readings wrist cuff 140-150/ 80s, and BP here much better. We believe SBP 200 is inaccurate and false reading  Occipital headaches - Headaches occur approximately once or twice per month. - Pain localized to the occipital region. - Headaches are persistent and can last a long time. - No associated symptoms of nausea, vomiting, or visual disturbances. - BC powder provides partial relief, but not always immediate. - Previously evaluated by a neurologist with a scan performed; no definitive etiology identified.          08/16/2024    2:27 PM 08/13/2024    2:12 PM 08/07/2023    3:09 PM  Depression screen PHQ 2/9  Decreased Interest 0 0 0  Down, Depressed, Hopeless 0 0 0  PHQ - 2 Score 0 0 0  Altered sleeping 0 0 0  Tired, decreased energy 0 0 0  Change in appetite 0 0 0  Feeling bad or failure about yourself  0 0 0  Trouble  concentrating 0 0 0  Moving slowly or fidgety/restless 0 0 0  Suicidal thoughts 0 0 0  PHQ-9 Score 0 0 0  Difficult doing work/chores Not difficult at all Not difficult at all Not difficult at all       08/16/2024    2:27 PM 07/24/2021    8:50 AM 03/27/2021    2:12 PM  GAD 7 : Generalized Anxiety Score  Nervous, Anxious, on Edge 0 0 0  Control/stop worrying 0 0 0  Worry too much - different things 0 0 0  Trouble relaxing 0 0 0  Restless 0 0 0  Easily annoyed or irritable 0 0 0  Afraid - awful might happen 0 0 0  Total GAD 7 Score 0 0 0  Anxiety Difficulty Not difficult at all Not difficult at all Not difficult at all    Social History   Tobacco Use   Smoking status: Never   Smokeless tobacco: Never  Vaping Use   Vaping status: Never Used  Substance Use Topics   Alcohol use: Never   Drug use: Never    Review of Systems Per HPI unless specifically indicated above     Objective:    BP 130/80 (BP Location: Left Arm, Cuff Size: Normal)   Pulse 72   Ht 5' 4 (1.626 m)   Wt 142 lb (64.4 kg)   SpO2 99%   BMI 24.37 kg/m  Wt Readings from Last 3 Encounters:  08/16/24 142 lb (64.4 kg)  12/17/23 145 lb (65.8 kg)  06/13/23 146 lb (66.2 kg)    Physical Exam Vitals and nursing note reviewed.  Constitutional:      General: He is not in acute distress.    Appearance: He is well-developed. He is not diaphoretic.     Comments: Well-appearing, comfortable, cooperative  HENT:     Head: Normocephalic and atraumatic.  Eyes:     General:        Right eye: No discharge.        Left eye: No discharge.     Conjunctiva/sclera: Conjunctivae normal.  Neck:     Thyroid: No thyromegaly.  Cardiovascular:     Rate and Rhythm: Normal rate and regular rhythm.     Pulses: Normal pulses.     Heart sounds: Normal heart sounds. No murmur heard. Pulmonary:     Effort: Pulmonary effort is normal. No respiratory distress.     Breath sounds: Normal breath sounds. No wheezing or rales.   Musculoskeletal:        General: Normal range of motion.     Cervical back: Normal range of motion and neck supple.  Lymphadenopathy:     Cervical: No cervical adenopathy.  Skin:    General: Skin is warm and dry.     Findings: No erythema or rash.  Neurological:     Mental Status: He is alert and oriented to person, place, and time. Mental status is at baseline.  Psychiatric:        Behavior: Behavior normal.     Comments: Well groomed, good eye contact, normal speech and thoughts     Results for orders placed or performed in visit on 12/17/23  POCT HgB A1C   Collection Time: 12/17/23  3:22 PM  Result Value Ref Range   Hemoglobin A1C 6.0 (A) 4.0 - 5.6 %   HbA1c POC (<> result, manual entry)     HbA1c, POC (prediabetic range)     HbA1c, POC (controlled diabetic range)    Lipid panel   Collection Time: 12/17/23  3:35 PM  Result Value Ref Range   Cholesterol 151 <200 mg/dL   HDL 59 > OR = 40 mg/dL   Triglycerides 65 <849 mg/dL   LDL Cholesterol (Calc) 78 mg/dL (calc)   Total CHOL/HDL Ratio 2.6 <5.0 (calc)   Non-HDL Cholesterol (Calc) 92 <869 mg/dL (calc)  COMPLETE METABOLIC PANEL WITH GFR   Collection Time: 12/17/23  3:35 PM  Result Value Ref Range   Glucose, Bld 92 65 - 99 mg/dL   BUN 13 7 - 25 mg/dL   Creat 8.94 9.39 - 8.70 mg/dL   eGFR 87 > OR = 60 fO/fpw/8.26f7   BUN/Creatinine Ratio SEE NOTE: 6 - 22 (calc)   Sodium 141 135 - 146 mmol/L   Potassium 4.2 3.5 - 5.3 mmol/L   Chloride 103 98 - 110 mmol/L   CO2 31 20 - 32 mmol/L   Calcium  10.6 (H) 8.6 - 10.3 mg/dL   Total Protein 7.8 6.1 - 8.1 g/dL   Albumin 4.9 3.6 - 5.1 g/dL   Globulin 2.9 1.9 - 3.7 g/dL (calc)   AG Ratio 1.7 1.0 - 2.5 (calc)   Total Bilirubin 1.0 0.2 - 1.2 mg/dL   Alkaline phosphatase (APISO) 73 36 - 130 U/L   AST 25 10 - 40 U/L   ALT 36 9 - 46 U/L  CBC with Differential/Platelet   Collection Time:  12/17/23  3:35 PM  Result Value Ref Range   WBC 4.6 3.8 - 10.8 Thousand/uL   RBC 4.41 4.20 -  5.80 Million/uL   Hemoglobin 13.8 13.2 - 17.1 g/dL   HCT 58.7 61.4 - 49.9 %   MCV 93.4 80.0 - 100.0 fL   MCH 31.3 27.0 - 33.0 pg   MCHC 33.5 32.0 - 36.0 g/dL   RDW 86.4 88.9 - 84.9 %   Platelets 168 140 - 400 Thousand/uL   MPV 12.1 7.5 - 12.5 fL   Neutro Abs 2,498 1,500 - 7,800 cells/uL   Absolute Lymphocytes 1,509 850 - 3,900 cells/uL   Absolute Monocytes 524 200 - 950 cells/uL   Eosinophils Absolute 60 15 - 500 cells/uL   Basophils Absolute 9 0 - 200 cells/uL   Neutrophils Relative % 54.3 %   Total Lymphocyte 32.8 %   Monocytes Relative 11.4 %   Eosinophils Relative 1.3 %   Basophils Relative 0.2 %  PSA   Collection Time: 12/17/23  3:35 PM  Result Value Ref Range   PSA 1.25 < OR = 4.00 ng/mL  TSH   Collection Time: 12/17/23  3:35 PM  Result Value Ref Range   TSH 1.22 0.40 - 4.50 mIU/L  Cologuard   Collection Time: 01/02/24 12:01 AM  Result Value Ref Range   COLOGUARD Negative Negative      Assessment & Plan:   Problem List Items Addressed This Visit     Essential hypertension - Primary   Other Visit Diagnoses       Episodic migraine       Relevant Medications   rizatriptan (MAXALT-MLT) 10 MG disintegrating tablet     Needs flu shot         Influenza vaccine administered       Relevant Orders   Flu vaccine trivalent PF, 6mos and older(Flulaval,Afluria,Fluarix,Fluzone) (Completed)         Hypertension Blood pressure readings inconsistent at home vs office. Wrist cuff may be inaccurate. Posterior Headaches uncertain etiology, seem less likely but possible may relate to blood pressure.  Calibrate home BP cuff wrist today, seems to have variable readings SBP 200+ when not in correct positioning with arm. But improved home readings SBP 140-150 / DBP 80s - Note I also checked his wrist BP cuff on myself and his caregiver, both with normal range BP readings.  Manual BP reading today 130s/80s on our cuff  - On Amlodipine  5mg  daily - Advise using upper arm blood  pressure cuff. - Monitor home blood pressure, report high readings. - Reassess management if headaches persist or home readings elevated. - Consider 2nd agent for BP control if needed or dose inc Amlodipine   Chronic headache Previously evaluated by Neurology 2024, inconclusive work up and treatment. Headaches occur monthly, tension-type suggested by neurologist. No nausea or visual disturbances. Differential includes tension headaches and possible migraines.  Possible migraine type headache, we can cover with triptan trial - Prescribe rizatriptan dissolving tablets at headache onset, repeat in 2 hours if needed. - Monitor headache frequency and treatment response. - Consider adjusting blood pressure medication if headaches persist.  General Health Maintenance Discussed flu vaccination and colon cancer screening. Cologuard negative, no colonoscopy needed for three years. - Administer flu shot today. - Schedule next Cologuard test in three years.  Follow-up Plan for follow-up visit and lab work in January. - Schedule follow-up appointment and lab work for late January, preferably on a Wednesday. - Provide appointment details in writing.  Orders Placed This Encounter  Procedures   Flu vaccine trivalent PF, 6mos and older(Flulaval,Afluria,Fluarix,Fluzone)    Meds ordered this encounter  Medications   rizatriptan (MAXALT-MLT) 10 MG disintegrating tablet    Sig: Take 1 tablet (10 mg total) by mouth as needed. May repeat in 2 hours if needed    Dispense:  10 tablet    Refill:  2    Follow up plan: Return in about 4 months (around 12/16/2024) for 4 month physical, labs after.    Marsa Officer, DO [facility] 08/16/2024, 2:54 PM

## 2024-09-29 ENCOUNTER — Other Ambulatory Visit: Payer: Self-pay | Admitting: Family Medicine

## 2024-09-29 DIAGNOSIS — I10 Essential (primary) hypertension: Secondary | ICD-10-CM

## 2024-10-01 NOTE — Telephone Encounter (Signed)
 Requested Prescriptions  Refused Prescriptions Disp Refills   amLODipine  (NORVASC ) 5 MG tablet [Pharmacy Med Name: amLODIPine  Besylate 5 MG Oral Tablet] 90 tablet 0    Sig: Take 1 tablet by mouth once daily     Cardiovascular: Calcium  Channel Blockers 2 Passed - 10/01/2024 12:35 PM      Passed - Last BP in normal range    BP Readings from Last 1 Encounters:  08/16/24 130/80         Passed - Last Heart Rate in normal range    Pulse Readings from Last 1 Encounters:  08/16/24 72         Passed - Valid encounter within last 6 months    Recent Outpatient Visits           1 month ago Essential hypertension   [facility] [place], Marsa PARAS, [place]

## 2024-10-04 ENCOUNTER — Telehealth: Payer: Self-pay

## 2024-10-04 DIAGNOSIS — I10 Essential (primary) hypertension: Secondary | ICD-10-CM

## 2024-10-04 MED ORDER — AMLODIPINE BESYLATE 5 MG PO TABS: 5.0000 mg | ORAL_TABLET | Freq: Every day | ORAL | 3 refills | Status: AC

## 2024-10-04 NOTE — Telephone Encounter (Signed)
 Copied from CRM #[id number]. Topic: Clinical - Medication Question >> Oct 04, 2024  9:39 AM Kendralyn S wrote: Reason for CRM: patient has been without amLODipine  (NORVASC ) 5 MG tablet [Pharmacy Med Name: amLODIPine  Besylate 5 MG Oral Tablet] for 3 days.

## 2024-10-04 NOTE — Telephone Encounter (Signed)
 Refill has been sent to pharmacy.

## 2024-12-22 ENCOUNTER — Encounter: Payer: Self-pay | Admitting: Family Medicine

## 2024-12-22 ENCOUNTER — Ambulatory Visit: Admitting: Family Medicine

## 2024-12-22 VITALS — BP 134/80 | HR 85 | Ht 64.0 in | Wt 144.0 lb

## 2024-12-22 DIAGNOSIS — E7800 Pure hypercholesterolemia, unspecified: Secondary | ICD-10-CM

## 2024-12-22 DIAGNOSIS — G43909 Migraine, unspecified, not intractable, without status migrainosus: Secondary | ICD-10-CM

## 2024-12-22 DIAGNOSIS — G8929 Other chronic pain: Secondary | ICD-10-CM

## 2024-12-22 DIAGNOSIS — Z0000 Encounter for general adult medical examination without abnormal findings: Secondary | ICD-10-CM

## 2024-12-22 DIAGNOSIS — Z125 Encounter for screening for malignant neoplasm of prostate: Secondary | ICD-10-CM

## 2024-12-22 DIAGNOSIS — M7541 Impingement syndrome of right shoulder: Secondary | ICD-10-CM

## 2024-12-22 DIAGNOSIS — I10 Essential (primary) hypertension: Secondary | ICD-10-CM

## 2024-12-22 DIAGNOSIS — R7303 Prediabetes: Secondary | ICD-10-CM

## 2024-12-22 DIAGNOSIS — Z23 Encounter for immunization: Secondary | ICD-10-CM

## 2024-12-22 DIAGNOSIS — N401 Enlarged prostate with lower urinary tract symptoms: Secondary | ICD-10-CM

## 2024-12-22 DIAGNOSIS — F79 Unspecified intellectual disabilities: Secondary | ICD-10-CM

## 2024-12-22 MED ORDER — SHINGRIX 50 MCG/0.5ML IM SUSR: INTRAMUSCULAR | 1 refills | Status: AC

## 2024-12-22 MED ORDER — RIZATRIPTAN BENZOATE 10 MG PO TBDP: 10.0000 mg | ORAL_TABLET | ORAL | 2 refills | Status: AC | PRN

## 2024-12-22 MED ORDER — BACLOFEN 10 MG PO TABS: 10.0000 mg | ORAL_TABLET | Freq: Three times a day (TID) | ORAL | 3 refills | Status: AC | PRN

## 2024-12-22 MED ORDER — ROSUVASTATIN CALCIUM 20 MG PO TABS: 20.0000 mg | ORAL_TABLET | Freq: Every day | ORAL | 3 refills | Status: AC

## 2024-12-22 NOTE — Patient Instructions (Addendum)
 Thank you for coming to the office today.  Prevnar-20 pneumonia vaccine today, 1 dose lasts 5+ years  At the pharmacy, Shingles vaccine 2 doses 2-26 months apart.  Re order medications including Rizatriptan  Maxalt  as needed for migraine headaches  Labs today   Please schedule a Follow-up Appointment to: Return in about 6 months (around 06/21/2025) for 6 month follow-up PreDM A1c, BP, Headache.  If you have any other questions or concerns, please feel free to call the office or send a message through MyChart. You may also schedule an earlier appointment if necessary.  Additionally, you may be receiving a survey about your experience at our office within a few days to 1 week by e-mail or mail. We value your feedback.  Marsa Officer, DO [facility], [place]

## 2024-12-22 NOTE — Progress Notes (Signed)
 "  Subjective:    Patient ID: Peter Mcmahon, male    DOB: Dec 17, 1975, 49 y.o.   MRN: [id number]  Peter Mcmahon is a 49 y.o. male presenting on 12/22/2024 for Annual Exam   HPI  Discussed the use of AI scribe software for clinical note transcription with the patient, who gave verbal consent to proceed.  History of Present Illness   Peter Mcmahon is a 49 year old male who presents for an annual physical exam. History provided by caregiver today   Episodic Migraine Headache - Headaches occur approximately once or twice per month. - Pain localized to the occipital region. Needs re order on Rizatriptan , does not have currently - Previously evaluated by a neurologist with a scan performed; no definitive etiology identified.   Pre-Diabetes Mild elevation in A1c in past 5.9 to 6 Improving exercise, more water intake, and salads and avoiding sugar and carbs.  Lifestyle: - Family history of Diabetes - Diet (Still trying to improve low carb diet now less sweets, caregiver helps reduce his sweets, - Exercise (activity, some walking) Denies hypoglycemia, polyuria, visual changes, numbness or tingling.   CHRONIC HTN Home BP controlled usually Current Meds - Amlodipine  5mg  daily Did NOT take med today Fam history mother and grandmother on mom side HTN - sister Admits headaches 2 x per month Denies CP, dyspnea, edema, dizziness / lightheadedness   HYPERLIPIDEMIA: - Reports no concerns. 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 On baclofen  meloxicam  PRN   Hearing Loss, chronic L ear L ear deaf. R ear 80% Has hearing aids.     Health Maintenance   Cologuard completed 01/02/24  negative, repeat 3 years 2028  Prevnar-20 today  Due for Shingles vaccine at pharmacy age 35+     12/22/2024    8:31 AM 08/16/2024    2:27 PM 08/13/2024    2:12 PM  Depression screen PHQ 2/9  Decreased Interest 0 0 0  Down, Depressed, Hopeless 0 0 0  PHQ - 2 Score 0 0 0  Altered sleeping 0 0 0  Tired, decreased energy 0 0 0  Change in appetite 0 0 0  Feeling bad or failure about yourself  0 0 0  Trouble concentrating 0 0 0  Moving slowly or fidgety/restless 0 0 0  Suicidal thoughts 0 0 0  PHQ-9 Score 0 0  0   Difficult doing work/chores  Not difficult at all Not difficult at all     Data saved with a previous flowsheet row definition       12/22/2024    8:31 AM 08/16/2024    2:27 PM 07/24/2021    8:50 AM 03/27/2021    2:12 PM  GAD 7 : Generalized Anxiety Score  Nervous, Anxious, on Edge 0 0  0  0   Control/stop worrying 0 0  0  0   Worry too much - different things 0 0  0  0   Trouble relaxing 0 0  0  0   Restless 0 0  0  0   Easily annoyed or irritable 0 0  0  0   Afraid - awful might happen 0 0  0  0   Total GAD 7 Score 0 0 0 0  Anxiety Difficulty  Not difficult at all Not difficult at all Not difficult at all     Data saved with a previous flowsheet row definition     Past Medical History:  Diagnosis Date   Hypertension    History reviewed. No pertinent surgical history. Social History   Socioeconomic History   Marital status: Single    Spouse name: Not on file   Number of children: Not on file   Years of education: Not on file   Highest education level: High school graduate  Occupational History   Occupation: disability   Tobacco Use   Smoking status: Never   Smokeless tobacco: Never  Vaping Use   Vaping status: Never Used  Substance and Sexual Activity   Alcohol use: Never   Drug use: Never   Sexual activity: Not Currently  Other Topics Concern   Not on file  Social History Narrative   Attends church and sees friends or family often    Social  Drivers of Health   Tobacco Use: Low Risk (12/22/2024)   Patient History    Smoking Tobacco Use: Never    Smokeless Tobacco Use: Never    Passive Exposure: Not on file  Financial Resource Strain: Low Risk (08/13/2024)   Overall Financial Resource Strain (CARDIA)    Difficulty of Paying Living Expenses: Not hard at all  Food Insecurity: No Food Insecurity (08/13/2024)   Epic    Worried About Radiation Protection Practitioner of Food in the Last Year: Never true    Ran Out of Food in the Last Year: Never true  Transportation Needs: No Transportation Needs (08/13/2024)   Epic    Lack of Transportation (Medical): No    Lack of Transportation (Non-Medical): No  Physical Activity: Sufficiently Active (08/13/2024)   Exercise Vital Sign    Days of Exercise per Week: 7 days    Minutes of Exercise per Session: 30 min  Stress: No Stress Concern Present (08/13/2024)   [employer] of Occupational Health - Occupational Stress Questionnaire    Feeling of Stress: Not at all  Social Connections: Moderately Integrated (08/13/2024)   Social Connection and Isolation Panel    Frequency of Communication with Friends and Family: Three times a week    Frequency of Social Gatherings with Friends and Family: Not on file    Attends Religious Services: More than 4 times per year    Active Member of Clubs or Organizations: No    Attends Banker Meetings: Never    Marital Status: Living with partner  Intimate Partner Violence: Not At Risk (08/13/2024)   Epic    Fear of Current or Ex-Partner: No    Emotionally Abused: No    Physically Abused: No    Sexually Abused: No  Depression (PHQ2-9): Low Risk (12/22/2024)   Depression (PHQ2-9)    PHQ-2 Score: 0  Alcohol Screen: Low Risk (08/13/2024)   Alcohol Screen    Last Alcohol Screening Score (AUDIT): 0  Housing: Unknown (08/13/2024)   Epic    Unable to Pay for Housing in the Last Year: No    Number of Times Moved in the Last Year: Not on file    Homeless in the Last  Year: No  Utilities: Not At Risk (08/13/2024)   Epic    Threatened with loss of utilities: No  Health Literacy: Adequate Health Literacy (08/13/2024)   B1300 Health Literacy    Frequency of need  for help with medical instructions: Never   Family History  Problem Relation Age of Onset   Diabetes Father    Colon cancer Father    Hypertension Mother    Diabetes Sister    Diabetes Brother    Diabetes Paternal Uncle    Diabetes Paternal Grandfather    Prostate cancer Neg Hx    Current Outpatient Medications on File Prior to Visit  Medication Sig   amLODipine  (NORVASC ) 5 MG tablet Take 1 tablet (5 mg total) by mouth daily.   No current facility-administered medications on file prior to visit.    Review of Systems  Constitutional:  Negative for activity change, appetite change, chills, diaphoresis, fatigue and fever.  HENT:  Negative for congestion and hearing loss.   Eyes:  Negative for visual disturbance.  Respiratory:  Negative for cough, chest tightness, shortness of breath and wheezing.   Cardiovascular:  Negative for chest pain, palpitations and leg swelling.  Gastrointestinal:  Negative for abdominal pain, constipation, diarrhea, nausea and vomiting.  Genitourinary:  Negative for dysuria, frequency and hematuria.  Musculoskeletal:  Negative for arthralgias and neck pain.  Skin:  Negative for rash.  Neurological:  Negative for dizziness, weakness, light-headedness, numbness and headaches.  Hematological:  Negative for adenopathy.  Psychiatric/Behavioral:  Negative for behavioral problems, dysphoric mood and sleep disturbance.    Per HPI unless specifically indicated above     Objective:    BP 134/80 (BP Location: Left Arm, Cuff Size: Normal)   Pulse 85   Ht 5' 4 (1.626 m)   Wt 144 lb (65.3 kg)   SpO2 98%   BMI 24.72 kg/m   Wt Readings from Last 3 Encounters:  12/22/24 144 lb (65.3 kg)  08/16/24 142 lb (64.4 kg)  12/17/23 145 lb (65.8 kg)    Physical Exam Vitals  and nursing note reviewed.  Constitutional:      General: He is not in acute distress.    Appearance: He is well-developed. He is not diaphoretic.     Comments: Well-appearing, comfortable, cooperative  HENT:     Head: Normocephalic and atraumatic.  Eyes:     General:        Right eye: No discharge.        Left eye: No discharge.     Conjunctiva/sclera: Conjunctivae normal.     Pupils: Pupils are equal, round, and reactive to light.  Neck:     Thyroid: No thyromegaly.     Vascular: No carotid bruit.  Cardiovascular:     Rate and Rhythm: Normal rate and regular rhythm.     Pulses: Normal pulses.     Heart sounds: Normal heart sounds. No murmur heard. Pulmonary:     Effort: Pulmonary effort is normal. No respiratory distress.     Breath sounds: Normal breath sounds. No wheezing or rales.  Abdominal:     General: Bowel sounds are normal. There is no distension.     Palpations: Abdomen is soft. There is no mass.     Tenderness: There is no abdominal tenderness.  Musculoskeletal:        General: No tenderness. Normal range of motion.     Cervical back: Normal range of motion and neck supple.     Right lower leg: No edema.     Left lower leg: No edema.     Comments: Upper / Lower Extremities: - Normal muscle tone, strength bilateral upper extremities 5/5, lower extremities 5/5  Lymphadenopathy:     Cervical: No cervical  adenopathy.  Skin:    General: Skin is warm and dry.     Findings: No erythema or rash.  Neurological:     Mental Status: He is alert and oriented to person, place, and time.     Comments: Distal sensation intact to light touch all extremities  Psychiatric:        Mood and Affect: Mood normal.        Behavior: Behavior normal.        Thought Content: Thought content normal.     Comments: Well groomed, good eye contact, at baseline, limited speech at baseline, mostly non verbal communications.     Results for orders placed or performed in visit on 12/17/23   POCT HgB A1C   Collection Time: 12/17/23  3:22 PM  Result Value Ref Range   Hemoglobin A1C 6.0 (A) 4.0 - 5.6 %   HbA1c POC (<> result, manual entry)     HbA1c, POC (prediabetic range)     HbA1c, POC (controlled diabetic range)    Lipid panel   Collection Time: 12/17/23  3:35 PM  Result Value Ref Range   Cholesterol 151 <200 mg/dL   HDL 59 > OR = 40 mg/dL   Triglycerides 65 <849 mg/dL   LDL Cholesterol (Calc) 78 mg/dL (calc)   Total CHOL/HDL Ratio 2.6 <5.0 (calc)   Non-HDL Cholesterol (Calc) 92 <869 mg/dL (calc)  COMPLETE METABOLIC PANEL WITH GFR   Collection Time: 12/17/23  3:35 PM  Result Value Ref Range   Glucose, Bld 92 65 - 99 mg/dL   BUN 13 7 - 25 mg/dL   Creat 8.94 9.39 - 8.70 mg/dL   eGFR 87 > OR = 60 fO/fpw/8.26f7   BUN/Creatinine Ratio SEE NOTE: 6 - 22 (calc)   Sodium 141 135 - 146 mmol/L   Potassium 4.2 3.5 - 5.3 mmol/L   Chloride 103 98 - 110 mmol/L   CO2 31 20 - 32 mmol/L   Calcium  10.6 (H) 8.6 - 10.3 mg/dL   Total Protein 7.8 6.1 - 8.1 g/dL   Albumin 4.9 3.6 - 5.1 g/dL   Globulin 2.9 1.9 - 3.7 g/dL (calc)   AG Ratio 1.7 1.0 - 2.5 (calc)   Total Bilirubin 1.0 0.2 - 1.2 mg/dL   Alkaline phosphatase (APISO) 73 36 - 130 U/L   AST 25 10 - 40 U/L   ALT 36 9 - 46 U/L  CBC with Differential/Platelet   Collection Time: 12/17/23  3:35 PM  Result Value Ref Range   WBC 4.6 3.8 - 10.8 Thousand/uL   RBC 4.41 4.20 - 5.80 Million/uL   Hemoglobin 13.8 13.2 - 17.1 g/dL   HCT 58.7 61.4 - 49.9 %   MCV 93.4 80.0 - 100.0 fL   MCH 31.3 27.0 - 33.0 pg   MCHC 33.5 32.0 - 36.0 g/dL   RDW 86.4 88.9 - 84.9 %   Platelets 168 140 - 400 Thousand/uL   MPV 12.1 7.5 - 12.5 fL   Neutro Abs 2,498 1,500 - 7,800 cells/uL   Absolute Lymphocytes 1,509 850 - 3,900 cells/uL   Absolute Monocytes 524 200 - 950 cells/uL   Eosinophils Absolute 60 15 - 500 cells/uL   Basophils Absolute 9 0 - 200 cells/uL   Neutrophils Relative % 54.3 %   Total Lymphocyte 32.8 %   Monocytes Relative 11.4 %    Eosinophils Relative 1.3 %   Basophils Relative 0.2 %  PSA   Collection Time: 12/17/23  3:35 PM  Result Value  Ref Range   PSA 1.25 < OR = 4.00 ng/mL  TSH   Collection Time: 12/17/23  3:35 PM  Result Value Ref Range   TSH 1.22 0.40 - 4.50 mIU/L  Cologuard   Collection Time: 01/02/24 12:01 AM  Result Value Ref Range   COLOGUARD Negative Negative      Assessment & Plan:   Problem List Items Addressed This Visit     Chronic right shoulder pain   Relevant Medications   baclofen  (LIORESAL ) 10 MG tablet   Essential hypertension   Relevant Medications   rosuvastatin  (CRESTOR ) 20 MG tablet   Other Relevant Orders   CBC with Differential/Platelet   TSH   Comprehensive metabolic panel with GFR   Hypercholesteremia   Relevant Medications   rosuvastatin  (CRESTOR ) 20 MG tablet   Other Relevant Orders   Lipid panel   TSH   Comprehensive metabolic panel with GFR   Intellectual disability   Pre-diabetes   Relevant Orders   Hemoglobin A1c   Other Visit Diagnoses       Annual physical exam    -  Primary   Relevant Orders   Lipid panel   Hemoglobin A1c   CBC with Differential/Platelet   PSA   TSH   Comprehensive metabolic panel with GFR     Screening PSA (prostate specific antigen)       Relevant Orders   PSA     BPH associated with nocturia       Relevant Orders   PSA     Need for Streptococcus pneumoniae vaccination       Relevant Orders   Pneumococcal conjugate vaccine 20-valent (Completed)     Need for shingles vaccine       Relevant Medications   SHINGRIX  injection     Rotator cuff impingement syndrome, right       Relevant Medications   baclofen  (LIORESAL ) 10 MG tablet     Episodic migraine       Relevant Medications   baclofen  (LIORESAL ) 10 MG tablet   rosuvastatin  (CRESTOR ) 20 MG tablet   rizatriptan  (MAXALT -MLT) 10 MG disintegrating tablet        Updated Health Maintenance information Reviewed recent lab results with patient Encouraged improvement to  lifestyle with diet and exercise Goal of weight loss  Adult Wellness Visit History of Developmental Delay / Intellectual Disability, relies on caregiver support Annual wellness visit conducted. Blood pressure improved upon recheck.  Cologuard test negates need for colonoscopy until 2028. - Administered Prevnar 20 vaccine. - Advised shingles vaccine at pharmacy, noted flu-like side effects. Printed order rx - Continue current medications. - Check PSA for screening - Ordered blood work, will review results.  Essential hypertension Blood pressure initially elevated, likely due to missed amlodipine  dose. Improved upon recheck. Current regimen adequate. - Continue amlodipine  5mg  daily as prescribed. - Ensure consistent medication intake.  Pure hypercholesterolemia Cholesterol medication refills low. Current regimen adequate. - Provided additional refills for cholesterol medication.  Pre-diabetes Discussed diet, lifestyle, and exercise. - Continue current diet and exercise regimen. - Review blood work for glucose levels.  Chronic R Rotator Cuff Impingement / Shoulder Pain Refill Baclofen  muscle relaxant AS NEEDED  Episodic migraine Migraine medication not obtained. Experiences headaches once or twice a month. - Reordered rizatriptan /Maxalt . - Advised use as needed for severe headaches.         Orders Placed This Encounter  Procedures   Pneumococcal conjugate vaccine 20-valent   Lipid panel    Has the patient  fasted?:   Yes   Hemoglobin A1c   CBC with Differential/Platelet   PSA   TSH   Comprehensive metabolic panel with GFR    Has the patient fasted?:   Yes    Meds ordered this encounter  Medications   SHINGRIX  injection    Sig: Inject 0.5 mL into muscle for shingles vaccine. Repeat dose in 2-6 months.    Dispense:  0.5 mL    Refill:  1   baclofen  (LIORESAL ) 10 MG tablet    Sig: Take 1 tablet (10 mg total) by mouth 3 (three) times daily as needed for muscle  spasms.    Dispense:  270 each    Refill:  3    Increase frequency and change to 90 day   rosuvastatin  (CRESTOR ) 20 MG tablet    Sig: Take 1 tablet (20 mg total) by mouth at bedtime.    Dispense:  90 tablet    Refill:  3    Add refills to future   rizatriptan  (MAXALT -MLT) 10 MG disintegrating tablet    Sig: Take 1 tablet (10 mg total) by mouth as needed. May repeat in 2 hours if needed    Dispense:  10 tablet    Refill:  2     Follow up plan: Return in about 6 months (around 06/21/2025) for 6 month follow-up PreDM A1c, BP, Headache.  Marsa Officer, DO [facility] 12/22/2024, 9:26 AM  "

## 2024-12-23 LAB — HEMOGLOBIN A1C
Hgb A1c MFr Bld: 5.8 % — ABNORMAL HIGH
Mean Plasma Glucose: 120 mg/dL
eAG (mmol/L): 6.6 mmol/L

## 2024-12-23 LAB — COMPREHENSIVE METABOLIC PANEL WITH GFR
AG Ratio: 1.7 (calc) (ref 1.0–2.5)
ALT: 32 U/L (ref 9–46)
AST: 23 U/L (ref 10–35)
Albumin: 4.9 g/dL (ref 3.6–5.1)
Alkaline phosphatase (APISO): 82 U/L (ref 35–144)
BUN: 23 mg/dL (ref 7–25)
CO2: 27 mmol/L (ref 20–32)
Calcium: 9.8 mg/dL (ref 8.6–10.3)
Chloride: 104 mmol/L (ref 98–110)
Creat: 1 mg/dL (ref 0.70–1.30)
Globulin: 2.9 g/dL (ref 1.9–3.7)
Glucose, Bld: 105 mg/dL — ABNORMAL HIGH (ref 65–99)
Potassium: 4.2 mmol/L (ref 3.5–5.3)
Sodium: 140 mmol/L (ref 135–146)
Total Bilirubin: 1 mg/dL (ref 0.2–1.2)
Total Protein: 7.8 g/dL (ref 6.1–8.1)
eGFR: 92 mL/min/{1.73_m2}

## 2024-12-23 LAB — CBC WITH DIFFERENTIAL/PLATELET
Absolute Lymphocytes: 1609 {cells}/uL (ref 850–3900)
Absolute Monocytes: 420 {cells}/uL (ref 200–950)
Basophils Absolute: 21 {cells}/uL (ref 0–200)
Basophils Relative: 0.5 %
Eosinophils Absolute: 71 {cells}/uL (ref 15–500)
Eosinophils Relative: 1.7 %
HCT: 44 % (ref 39.4–51.1)
Hemoglobin: 14.7 g/dL (ref 13.2–17.1)
MCH: 31.7 pg (ref 27.0–33.0)
MCHC: 33.4 g/dL (ref 31.6–35.4)
MCV: 95 fL (ref 81.4–101.7)
MPV: 11.9 fL (ref 7.5–12.5)
Monocytes Relative: 10 %
Neutro Abs: 2079 {cells}/uL (ref 1500–7800)
Neutrophils Relative %: 49.5 %
Platelets: 174 10*3/uL (ref 140–400)
RBC: 4.63 Million/uL (ref 4.20–5.80)
RDW: 13.6 % (ref 11.0–15.0)
Total Lymphocyte: 38.3 %
WBC: 4.2 10*3/uL (ref 3.8–10.8)

## 2024-12-23 LAB — LIPID PANEL
Cholesterol: 175 mg/dL
HDL: 73 mg/dL
LDL Cholesterol (Calc): 87 mg/dL
Non-HDL Cholesterol (Calc): 102 mg/dL
Total CHOL/HDL Ratio: 2.4 (calc)
Triglycerides: 67 mg/dL

## 2024-12-23 LAB — PSA: PSA: 1.44 ng/mL

## 2024-12-23 LAB — TSH: TSH: 3.08 m[IU]/L (ref 0.40–4.50)

## 2025-06-21 ENCOUNTER — Ambulatory Visit: Admitting: Family Medicine

## 2025-08-26 ENCOUNTER — Ambulatory Visit
